# Patient Record
Sex: Female | Born: 1985 | Hispanic: Yes | Marital: Married | Smoking: Never smoker
Health system: Southern US, Community
[De-identification: ages and names within clinical notes are randomized; demographics above are authoritative.]

## PROBLEM LIST (undated history)

## (undated) ENCOUNTER — Inpatient Hospital Stay (HOSPITAL_COMMUNITY): Payer: Self-pay

## (undated) DIAGNOSIS — Z789 Other specified health status: Secondary | ICD-10-CM

## (undated) HISTORY — PX: NO PAST SURGERIES: SHX2092

## (undated) HISTORY — DX: Other specified health status: Z78.9

---
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
## 2004-05-22 ENCOUNTER — Inpatient Hospital Stay (HOSPITAL_COMMUNITY): Admission: AD | Admit: 2004-05-22 | Discharge: 2004-05-22 | Payer: Self-pay | Admitting: *Deleted

## 2004-05-24 ENCOUNTER — Inpatient Hospital Stay (HOSPITAL_COMMUNITY): Admission: AD | Admit: 2004-05-24 | Discharge: 2004-05-24 | Payer: Self-pay | Admitting: Obstetrics & Gynecology

## 2005-04-07 ENCOUNTER — Ambulatory Visit (HOSPITAL_COMMUNITY): Admission: RE | Admit: 2005-04-07 | Discharge: 2005-04-07 | Payer: Self-pay | Admitting: Obstetrics

## 2005-04-15 ENCOUNTER — Inpatient Hospital Stay (HOSPITAL_COMMUNITY): Admission: AD | Admit: 2005-04-15 | Discharge: 2005-04-15 | Payer: Self-pay | Admitting: Obstetrics

## 2005-05-21 ENCOUNTER — Inpatient Hospital Stay (HOSPITAL_COMMUNITY): Admission: AD | Admit: 2005-05-21 | Discharge: 2005-05-21 | Payer: Self-pay | Admitting: Obstetrics & Gynecology

## 2005-09-20 ENCOUNTER — Inpatient Hospital Stay (HOSPITAL_COMMUNITY): Admission: AD | Admit: 2005-09-20 | Discharge: 2005-09-20 | Payer: Self-pay | Admitting: Obstetrics & Gynecology

## 2005-09-20 ENCOUNTER — Inpatient Hospital Stay (HOSPITAL_COMMUNITY): Admission: AD | Admit: 2005-09-20 | Discharge: 2005-09-23 | Payer: Self-pay | Admitting: Obstetrics & Gynecology

## 2007-01-03 IMAGING — CR DG CHEST 1V
1 series · 1 of 1 positions shown · non-contrast
Comparison: none

CLINICAL DATA: Positive PPD.  Pregnant patient.  
 CHEST ? 1 VIEW:

[view not recorded]
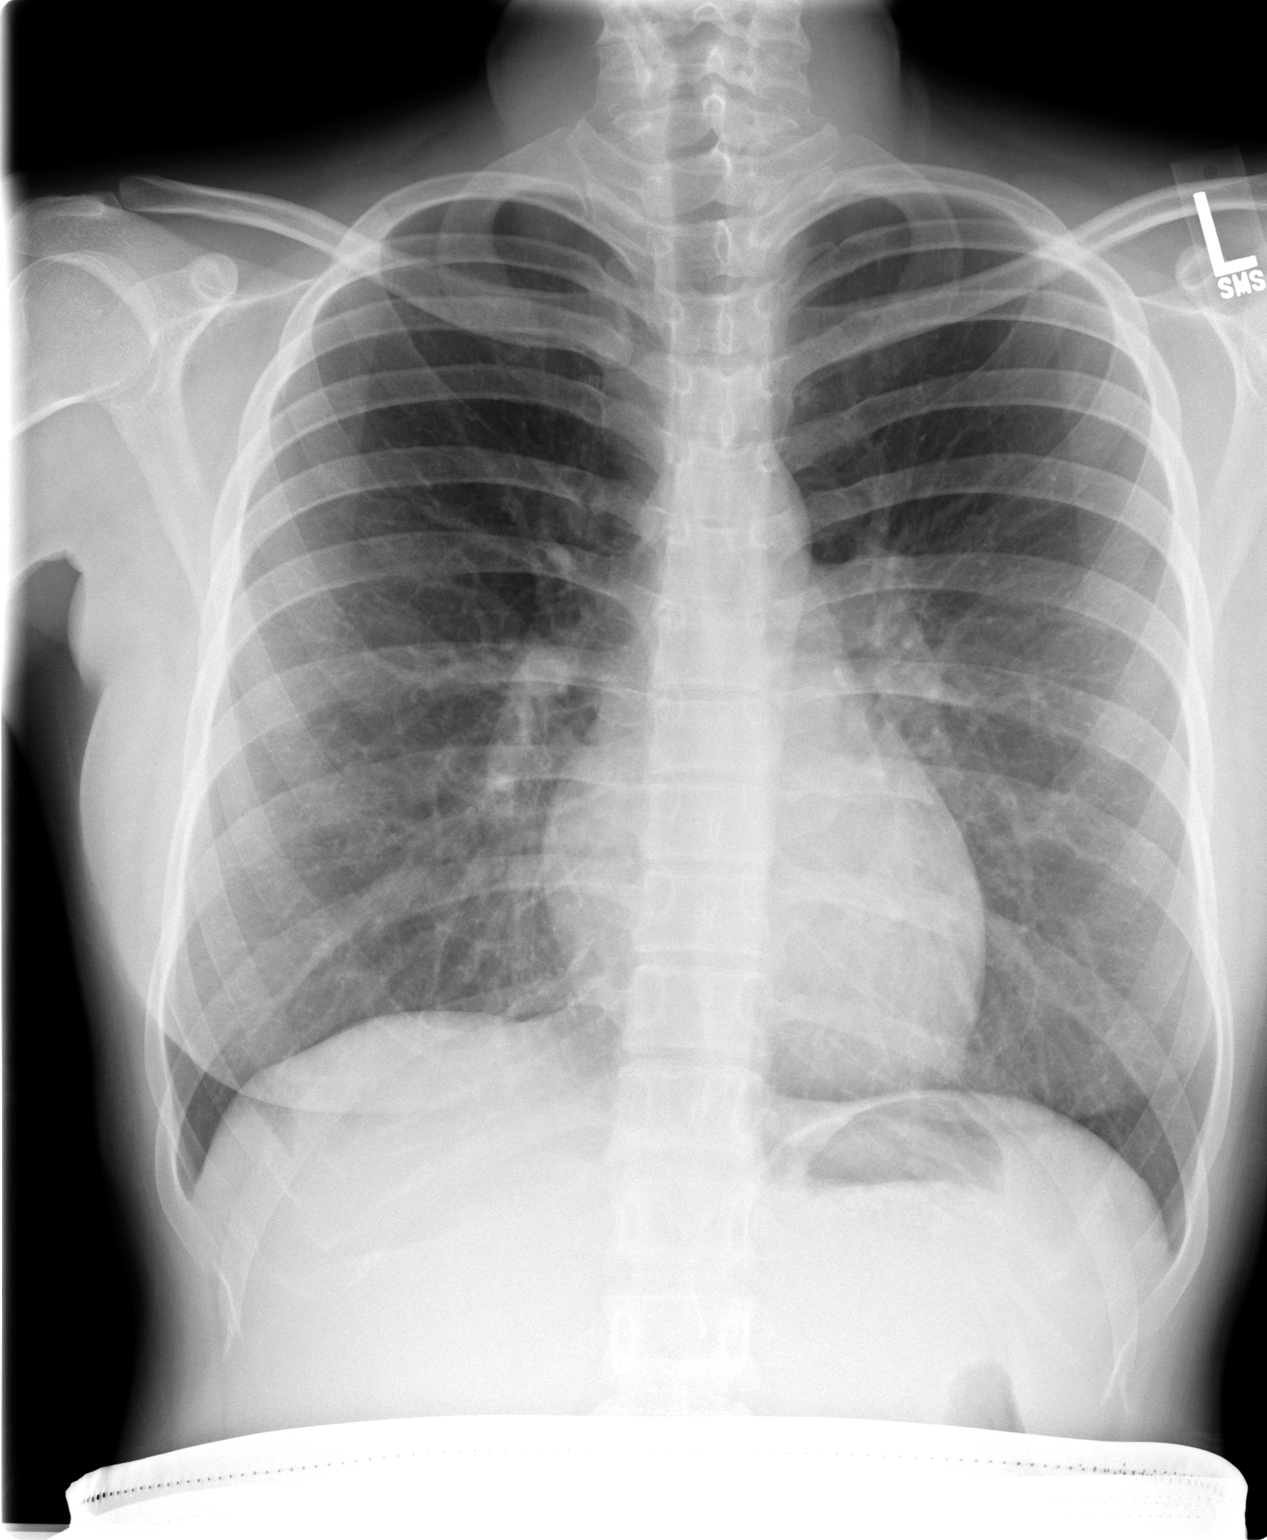

[1 of 1 positions shown; findings below may reference images not displayed]

FINDINGS: PA view of the chest was obtained with abdominal and pelvic shielding.
 Heart size and mediastinal contours are normal.  Both lungs are clear.  There is no evidence of pleural effusion.  No mass or adenopathy identified.
IMPRESSION: No active disease.

## 2009-08-05 ENCOUNTER — Inpatient Hospital Stay (HOSPITAL_COMMUNITY): Admission: AD | Admit: 2009-08-05 | Discharge: 2009-08-05 | Payer: Self-pay | Admitting: Obstetrics and Gynecology

## 2009-09-10 ENCOUNTER — Ambulatory Visit (HOSPITAL_COMMUNITY): Admission: RE | Admit: 2009-09-10 | Discharge: 2009-09-10 | Payer: Self-pay | Admitting: Obstetrics

## 2009-12-01 ENCOUNTER — Inpatient Hospital Stay (HOSPITAL_COMMUNITY): Admission: AD | Admit: 2009-12-01 | Discharge: 2009-12-03 | Payer: Self-pay | Admitting: Obstetrics

## 2010-08-02 LAB — RH IMMUNE GLOB WKUP(>/=20WKS)(NOT WOMEN'S HOSP)
Antibody Screen: NEGATIVE
Fetal Screen: NEGATIVE

## 2010-08-02 LAB — CBC
HCT: 34.2 % — ABNORMAL LOW (ref 36.0–46.0)
HCT: 36.9 % (ref 36.0–46.0)
Hemoglobin: 11.6 g/dL — ABNORMAL LOW (ref 12.0–15.0)
Hemoglobin: 12.4 g/dL (ref 12.0–15.0)
MCH: 28.1 pg (ref 26.0–34.0)
MCH: 28.7 pg (ref 26.0–34.0)
MCHC: 33.6 g/dL (ref 30.0–36.0)
MCHC: 34 g/dL (ref 30.0–36.0)
MCV: 83.7 fL (ref 78.0–100.0)
MCV: 84.3 fL (ref 78.0–100.0)
Platelets: 117 10*3/uL — ABNORMAL LOW (ref 150–400)
Platelets: 149 10*3/uL — ABNORMAL LOW (ref 150–400)
RBC: 4.05 MIL/uL (ref 3.87–5.11)
RBC: 4.41 MIL/uL (ref 3.87–5.11)
RDW: 13.2 % (ref 11.5–15.5)
RDW: 13.3 % (ref 11.5–15.5)
WBC: 11.5 10*3/uL — ABNORMAL HIGH (ref 4.0–10.5)
WBC: 9.1 10*3/uL (ref 4.0–10.5)

## 2010-08-02 LAB — RPR: RPR Ser Ql: NONREACTIVE

## 2010-08-05 LAB — RH IMMUNE GLOBULIN WORKUP (NOT WOMEN'S HOSP)
ABO/RH(D): B NEG
Antibody Screen: POSITIVE
DAT, IgG: NEGATIVE

## 2010-08-10 LAB — URINALYSIS, ROUTINE W REFLEX MICROSCOPIC
Bilirubin Urine: NEGATIVE
Glucose, UA: NEGATIVE mg/dL
Hgb urine dipstick: NEGATIVE
Ketones, ur: 15 mg/dL — AB
Nitrite: NEGATIVE
Protein, ur: NEGATIVE mg/dL
Specific Gravity, Urine: <1.005 — ABNORMAL LOW (ref 1.005–1.030)
Urobilinogen, UA: 0.2 mg/dL (ref 0.0–1.0)
pH: 6 (ref 5.0–8.0)

## 2012-12-20 LAB — OB RESULTS CONSOLE HEPATITIS B SURFACE ANTIGEN: HEP B S AG: NEGATIVE

## 2012-12-20 LAB — OB RESULTS CONSOLE RPR: RPR: NONREACTIVE

## 2012-12-20 LAB — OB RESULTS CONSOLE ANTIBODY SCREEN: Antibody Screen: NEGATIVE

## 2012-12-20 LAB — OB RESULTS CONSOLE RUBELLA ANTIBODY, IGM: Rubella: IMMUNE

## 2012-12-20 LAB — OB RESULTS CONSOLE GC/CHLAMYDIA
Chlamydia: NEGATIVE
Gonorrhea: NEGATIVE

## 2012-12-20 LAB — OB RESULTS CONSOLE ABO/RH: RH TYPE: NEGATIVE

## 2012-12-20 LAB — OB RESULTS CONSOLE HIV ANTIBODY (ROUTINE TESTING): HIV: NONREACTIVE

## 2013-05-18 NOTE — L&D Delivery Note (Signed)
Delivery Note At 3:14 PM a viable female was delivered via Vaginal, Spontaneous Delivery (Presentation: ; Occiput Anterior).  APGAR: , ; weight .   Placenta status: Intact, Spontaneous.  Cord: 3 vessels with the following complications: None.  Cord pH: not done  Anesthesia:   Episiotomy:  Lacerations:  Suture Repair: 2.0 Est. Blood Loss (mL):   Mom to postpartum.  Baby to Couplet care / Skin to Skin.  MARSHALL,BERNARD A 07/15/2013, 3:23 PM

## 2013-06-06 LAB — OB RESULTS CONSOLE GC/CHLAMYDIA
Chlamydia: NEGATIVE
Gonorrhea: NEGATIVE

## 2013-06-06 LAB — OB RESULTS CONSOLE GBS: GBS: NEGATIVE

## 2013-07-06 ENCOUNTER — Inpatient Hospital Stay (HOSPITAL_COMMUNITY): Admission: AD | Admit: 2013-07-06 | Payer: Self-pay | Source: Ambulatory Visit | Admitting: Obstetrics

## 2013-07-14 ENCOUNTER — Encounter (HOSPITAL_COMMUNITY): Payer: Self-pay | Admitting: *Deleted

## 2013-07-14 ENCOUNTER — Telehealth (HOSPITAL_COMMUNITY): Payer: Self-pay | Admitting: *Deleted

## 2013-07-14 NOTE — Telephone Encounter (Signed)
Preadmission screen Interpreter number [phone]

## 2013-07-15 ENCOUNTER — Encounter (HOSPITAL_COMMUNITY): Payer: Self-pay

## 2013-07-15 ENCOUNTER — Encounter (HOSPITAL_COMMUNITY): Payer: Medicaid Other | Admitting: Anesthesiology

## 2013-07-15 ENCOUNTER — Inpatient Hospital Stay (HOSPITAL_COMMUNITY)
Admission: RE | Admit: 2013-07-15 | Discharge: 2013-07-16 | DRG: 775 | Disposition: A | Discharge status: Home / Self Care | Payer: Medicaid Other | Source: Ambulatory Visit | Attending: Obstetrics | Admitting: Obstetrics

## 2013-07-15 ENCOUNTER — Inpatient Hospital Stay (HOSPITAL_COMMUNITY): Payer: Medicaid Other | Admitting: Anesthesiology

## 2013-07-15 DIAGNOSIS — O480 Post-term pregnancy: Principal | ICD-10-CM | POA: Diagnosis present

## 2013-07-15 DIAGNOSIS — Z3490 Encounter for supervision of normal pregnancy, unspecified, unspecified trimester: Secondary | ICD-10-CM

## 2013-07-15 LAB — CBC
HCT: 34.8 % — ABNORMAL LOW (ref 36.0–46.0)
Hemoglobin: 11.6 g/dL — ABNORMAL LOW (ref 12.0–15.0)
MCH: 27.1 pg (ref 26.0–34.0)
MCHC: 33.3 g/dL (ref 30.0–36.0)
MCV: 81.3 fL (ref 78.0–100.0)
Platelets: 161 10*3/uL (ref 150–400)
RBC: 4.28 MIL/uL (ref 3.87–5.11)
RDW: 13.7 % (ref 11.5–15.5)
WBC: 7.8 10*3/uL (ref 4.0–10.5)

## 2013-07-15 LAB — RPR: RPR Ser Ql: NONREACTIVE

## 2013-07-15 LAB — TYPE AND SCREEN
ABO/RH(D): B NEG
Antibody Screen: NEGATIVE

## 2013-07-15 MED ORDER — OXYCODONE-ACETAMINOPHEN 5-325 MG PO TABS: 1.0000 | ORAL_TABLET | ORAL | Status: DC | PRN

## 2013-07-15 MED ORDER — ONDANSETRON HCL 4 MG PO TABS: 4.0000 mg | ORAL_TABLET | ORAL | Status: DC | PRN

## 2013-07-15 MED ORDER — TETANUS-DIPHTH-ACELL PERTUSSIS 5-2.5-18.5 LF-MCG/0.5 IM SUSP
0.5000 mL | Freq: Once | INTRAMUSCULAR | Status: AC
  Administered 2013-07-16: 0.5 mL via INTRAMUSCULAR
  Filled 2013-07-15: qty 0.5

## 2013-07-15 MED ORDER — WITCH HAZEL-GLYCERIN EX PADS: 1.0000 "application " | MEDICATED_PAD | CUTANEOUS | Status: DC | PRN

## 2013-07-15 MED ORDER — LACTATED RINGERS IV SOLN
INTRAVENOUS | Status: DC
Start: 2013-07-15 — End: 2013-07-15
  Administered 2013-07-15: 08:00:00 via INTRAVENOUS

## 2013-07-15 MED ORDER — ZOLPIDEM TARTRATE 5 MG PO TABS: 5.0000 mg | ORAL_TABLET | Freq: Every evening | ORAL | Status: DC | PRN

## 2013-07-15 MED ORDER — FENTANYL 2.5 MCG/ML BUPIVACAINE 1/10 % EPIDURAL INFUSION (WH - ANES)
INTRAMUSCULAR | Status: DC | PRN
Start: 2013-07-15 — End: 2013-07-15
  Administered 2013-07-15: 14 mL/h via EPIDURAL

## 2013-07-15 MED ORDER — PRENATAL MULTIVITAMIN CH
1.0000 | ORAL_TABLET | Freq: Every day | ORAL | Status: DC
  Administered 2013-07-16: 1 via ORAL
  Filled 2013-07-15: qty 1

## 2013-07-15 MED ORDER — ONDANSETRON HCL 4 MG/2ML IJ SOLN: 4.0000 mg | INTRAMUSCULAR | Status: DC | PRN

## 2013-07-15 MED ORDER — DIBUCAINE 1 % RE OINT: 1.0000 "application " | TOPICAL_OINTMENT | RECTAL | Status: DC | PRN

## 2013-07-15 MED ORDER — PHENYLEPHRINE 40 MCG/ML (10ML) SYRINGE FOR IV PUSH (FOR BLOOD PRESSURE SUPPORT)
80.0000 ug | PREFILLED_SYRINGE | INTRAVENOUS | Status: DC | PRN
  Filled 2013-07-15: qty 2

## 2013-07-15 MED ORDER — FERROUS SULFATE 325 (65 FE) MG PO TABS
325.0000 mg | ORAL_TABLET | Freq: Two times a day (BID) | ORAL | Status: DC
  Administered 2013-07-16: 325 mg via ORAL
  Filled 2013-07-15: qty 1

## 2013-07-15 MED ORDER — DIPHENHYDRAMINE HCL 50 MG/ML IJ SOLN
12.5000 mg | INTRAMUSCULAR | Status: DC | PRN
Start: 2013-07-15 — End: 2013-07-16

## 2013-07-15 MED ORDER — OXYTOCIN 40 UNITS IN LACTATED RINGERS INFUSION - SIMPLE MED: 62.5000 mL/h | INTRAVENOUS | Status: DC

## 2013-07-15 MED ORDER — ACETAMINOPHEN 325 MG PO TABS: 650.0000 mg | ORAL_TABLET | ORAL | Status: DC | PRN

## 2013-07-15 MED ORDER — IBUPROFEN 600 MG PO TABS
600.0000 mg | ORAL_TABLET | Freq: Four times a day (QID) | ORAL | Status: DC
  Administered 2013-07-15 – 2013-07-16 (×4): 600 mg via ORAL
  Filled 2013-07-15 (×3): qty 1

## 2013-07-15 MED ORDER — SIMETHICONE 80 MG PO CHEW: 80.0000 mg | CHEWABLE_TABLET | ORAL | Status: DC | PRN

## 2013-07-15 MED ORDER — BENZOCAINE-MENTHOL 20-0.5 % EX AERO
1.0000 "application " | INHALATION_SPRAY | CUTANEOUS | Status: DC | PRN
  Administered 2013-07-15: 1 via TOPICAL
  Filled 2013-07-15: qty 56

## 2013-07-15 MED ORDER — ONDANSETRON HCL 4 MG/2ML IJ SOLN: 4.0000 mg | Freq: Four times a day (QID) | INTRAMUSCULAR | Status: DC | PRN

## 2013-07-15 MED ORDER — OXYTOCIN BOLUS FROM INFUSION: 500.0000 mL | INTRAVENOUS | Status: DC

## 2013-07-15 MED ORDER — LACTATED RINGERS IV SOLN: 500.0000 mL | Freq: Once | INTRAVENOUS | Status: DC

## 2013-07-15 MED ORDER — SENNOSIDES-DOCUSATE SODIUM 8.6-50 MG PO TABS
2.0000 | ORAL_TABLET | ORAL | Status: DC
  Administered 2013-07-15: 2 via ORAL
  Filled 2013-07-15: qty 2

## 2013-07-15 MED ORDER — FENTANYL 2.5 MCG/ML BUPIVACAINE 1/10 % EPIDURAL INFUSION (WH - ANES)
14.0000 mL/h | INTRAMUSCULAR | Status: DC | PRN
  Filled 2013-07-15: qty 125

## 2013-07-15 MED ORDER — LANOLIN HYDROUS EX OINT: TOPICAL_OINTMENT | CUTANEOUS | Status: DC | PRN

## 2013-07-15 MED ORDER — LACTATED RINGERS IV SOLN
500.0000 mL | INTRAVENOUS | Status: DC | PRN
  Administered 2013-07-15: 500 mL via INTRAVENOUS

## 2013-07-15 MED ORDER — EPHEDRINE 5 MG/ML INJ
10.0000 mg | INTRAVENOUS | Status: DC | PRN
  Filled 2013-07-15: qty 2

## 2013-07-15 MED ORDER — EPHEDRINE 5 MG/ML INJ
10.0000 mg | INTRAVENOUS | Status: DC | PRN
  Filled 2013-07-15: qty 2
  Filled 2013-07-15: qty 4

## 2013-07-15 MED ORDER — DIPHENHYDRAMINE HCL 25 MG PO CAPS: 25.0000 mg | ORAL_CAPSULE | Freq: Four times a day (QID) | ORAL | Status: DC | PRN

## 2013-07-15 MED ORDER — CITRIC ACID-SODIUM CITRATE 334-500 MG/5ML PO SOLN
30.0000 mL | ORAL | Status: DC | PRN
Start: 2013-07-15 — End: 2013-07-15

## 2013-07-15 MED ORDER — OXYTOCIN 40 UNITS IN LACTATED RINGERS INFUSION - SIMPLE MED
1.0000 m[IU]/min | INTRAVENOUS | Status: DC
  Administered 2013-07-15: 2 m[IU]/min via INTRAVENOUS
  Filled 2013-07-15: qty 1000

## 2013-07-15 MED ORDER — TERBUTALINE SULFATE 1 MG/ML IJ SOLN: 0.2500 mg | Freq: Once | INTRAMUSCULAR | Status: DC | PRN

## 2013-07-15 MED ORDER — PHENYLEPHRINE 40 MCG/ML (10ML) SYRINGE FOR IV PUSH (FOR BLOOD PRESSURE SUPPORT)
80.0000 ug | PREFILLED_SYRINGE | INTRAVENOUS | Status: DC | PRN
  Filled 2013-07-15: qty 2
  Filled 2013-07-15: qty 10

## 2013-07-15 MED ORDER — LIDOCAINE HCL (PF) 1 % IJ SOLN
30.0000 mL | INTRAMUSCULAR | Status: DC | PRN
  Filled 2013-07-15: qty 30

## 2013-07-15 MED ORDER — IBUPROFEN 600 MG PO TABS
600.0000 mg | ORAL_TABLET | Freq: Four times a day (QID) | ORAL | Status: DC | PRN
  Filled 2013-07-15: qty 1

## 2013-07-15 MED ORDER — FLEET ENEMA 7-19 GM/118ML RE ENEM: 1.0000 | ENEMA | RECTAL | Status: DC | PRN

## 2013-07-15 MED ORDER — LIDOCAINE HCL (PF) 1 % IJ SOLN
INTRAMUSCULAR | Status: DC | PRN
  Administered 2013-07-15 (×2): 4 mL

## 2013-07-15 MED ORDER — BUTORPHANOL TARTRATE 1 MG/ML IJ SOLN: 1.0000 mg | INTRAMUSCULAR | Status: DC | PRN

## 2013-07-15 NOTE — Anesthesia Procedure Notes (Signed)
Epidural Patient location during procedure: OB Start time: 07/15/2013 1:12 PM  Staffing Anesthesiologist: Aneesha Holloran A. Performed by: anesthesiologist   Preanesthetic Checklist Completed: patient identified, site marked, surgical consent, pre-op evaluation, timeout performed, IV checked, risks and benefits discussed and monitors and equipment checked  Epidural Patient position: sitting Prep: site prepped and draped and DuraPrep Patient monitoring: continuous pulse ox and blood pressure Approach: midline Injection technique: LOR air  Needle:  Needle type: Tuohy  Needle gauge: 17 G Needle length: 9 cm and 9 Needle insertion depth: 4 cm Catheter type: closed end flexible Catheter size: 19 Gauge Catheter at skin depth: 9 cm Test dose: negative and Other  Assessment Events: blood not aspirated, injection not painful, no injection resistance, negative IV test and no paresthesia  Additional Notes Patient identified. Risks and benefits discussed including failed block, incomplete  Pain control, post dural puncture headache, nerve damage, paralysis, blood pressure Changes, nausea, vomiting, reactions to medications-both toxic and allergic and post Partum back pain. All questions were answered. Patient expressed understanding and wished to proceed. Sterile technique was used throughout procedure. Epidural site was Dressed with sterile barrier dressing. No paresthesias, signs of intravascular injection Or signs of intrathecal spread were encountered.  Patient was more comfortable after the epidural was dosed. Please see RN's note for documentation of vital signs and FHR which are stable.

## 2013-07-15 NOTE — H&P (Signed)
And weekly this is Dr. Francoise CeoBernard Xitlalic Maslin dictating the history and physical on  Victoria Liu she's a 28 year old gravida 3 para 2 all to at 41 weeks she's due 07/08/1949 negative GBS admitted for induction cervix 3-4 cm 80-90% vertex -2 cervix posterior amniotomy performed the fluids clear she is on low-dose Pitocin Past medical history negative past surgical history negative Social history negative System review negative Physical well-developed female in no distress HEENT negative Lungs clear to P&A Heart regular rhythm no murmurs no gallops Breasts negative Abdomen term Pelvic as described above Extremities negative and

## 2013-07-15 NOTE — Anesthesia Preprocedure Evaluation (Signed)

## 2013-07-16 LAB — CBC
HCT: 33.5 % — ABNORMAL LOW (ref 36.0–46.0)
Hemoglobin: 11.2 g/dL — ABNORMAL LOW (ref 12.0–15.0)
MCH: 27.3 pg (ref 26.0–34.0)
MCHC: 33.4 g/dL (ref 30.0–36.0)
MCV: 81.5 fL (ref 78.0–100.0)
PLATELETS: 155 10*3/uL (ref 150–400)
RBC: 4.11 MIL/uL (ref 3.87–5.11)
RDW: 13.7 % (ref 11.5–15.5)
WBC: 10.3 10*3/uL (ref 4.0–10.5)

## 2013-07-16 MED ORDER — RHO D IMMUNE GLOBULIN 1500 UNIT/2ML IJ SOLN
300.0000 ug | Freq: Once | INTRAMUSCULAR | Status: AC
  Administered 2013-07-16: 300 ug via INTRAMUSCULAR
  Filled 2013-07-16: qty 2

## 2013-07-16 MED ORDER — INFLUENZA VAC SPLIT QUAD 0.5 ML IM SUSP
0.5000 mL | INTRAMUSCULAR | Status: AC
Start: 2013-07-16 — End: 2013-07-16
  Administered 2013-07-16: 0.5 mL via INTRAMUSCULAR
  Filled 2013-07-16: qty 0.5

## 2013-07-16 NOTE — Discharge Instructions (Signed)
Discharge instructions ° °· You can wash your hair °· Shower °· Eat what you want °· Drink what you want °· See me in 6 weeks °· Your ankles are going to swell more in the next 2 weeks than when pregnant °· No sex for 6 weeks ° ° °MARSHALL,BERNARD A, MD 07/16/2013 ° ° °

## 2013-07-16 NOTE — Discharge Summary (Signed)
Obstetric Discharge Summary Reason for Admission: induction of labor Prenatal Procedures: none Intrapartum Procedures: spontaneous vaginal delivery Postpartum Procedures: none Complications-Operative and Postpartum: none Hemoglobin  Date Value Ref Range Status  07/16/2013 11.2* 12.0 - 15.0 g/dL Final     HCT  Date Value Ref Range Status  07/16/2013 33.5* 36.0 - 46.0 % Final    Physical Exam:  General: alert Lochia: appropriate Uterine Fundus: firm Incision: healing well DVT Evaluation: No evidence of DVT seen on physical exam.  Discharge Diagnoses: Term Pregnancy-delivered  Discharge Information: Date: 07/16/2013 Activity: pelvic rest Diet: routine Medications: Percocet Condition: stable Instructions: refer to practice specific booklet Discharge to: home Follow-up Information   Follow up with Victoria CosierMARSHALL,[name], MD.   Specialty:  Obstetrics and Gynecology   Contact information:   [address] [place] [id number] [phone]       Newborn Data: Live born female  Birth Weight: 8 lb 4.8 oz (3765 g) APGAR: 9, 9  Home with mother.  Victoria Liu Liu 07/16/2013, 6:57 AM

## 2013-07-16 NOTE — Anesthesia Postprocedure Evaluation (Signed)
Anesthesia Post Note  Patient: Victoria Liu  Procedure(s) Performed: * No procedures listed *  Anesthesia type: Epidural  Patient location: Mother/Baby  Post pain: Pain level controlled  Post assessment: Post-op Vital signs reviewed  Last Vitals:  Filed Vitals:   07/16/13 0600  BP: 103/68  Pulse: 73  Temp: 37 C  Resp: 20    Post vital signs: Reviewed  Level of consciousness:alert  Complications: No apparent anesthesia complications

## 2013-07-17 LAB — RH IG WORKUP (INCLUDES ABO/RH)
ABO/RH(D): B NEG
FETAL SCREEN: NEGATIVE
GESTATIONAL AGE(WKS): 41
Unit division: 0

## 2013-07-17 NOTE — Progress Notes (Signed)
Post discharge chart review completed.  

## 2014-03-19 ENCOUNTER — Encounter (HOSPITAL_COMMUNITY): Payer: Self-pay

## 2016-02-05 ENCOUNTER — Other Ambulatory Visit (INDEPENDENT_AMBULATORY_CARE_PROVIDER_SITE_OTHER): Payer: Self-pay

## 2016-02-05 DIAGNOSIS — Z3481 Encounter for supervision of other normal pregnancy, first trimester: Secondary | ICD-10-CM

## 2016-02-05 LAB — POCT URINALYSIS DIPSTICK
Bilirubin, UA: NEGATIVE
Blood, UA: NEGATIVE
GLUCOSE UA: NEGATIVE
KETONES UA: NEGATIVE
Nitrite, UA: NEGATIVE
PROTEIN UA: NEGATIVE
Spec Grav, UA: 1.02
Urobilinogen, UA: 0.2
pH, UA: 7.5

## 2016-02-05 LAB — POCT UA - MICROSCOPIC ONLY

## 2016-02-05 LAB — OB RESULTS CONSOLE HEPATITIS B SURFACE ANTIGEN: Hepatitis B Surface Ag: NEGATIVE

## 2016-02-05 LAB — HIV ANTIBODY (ROUTINE TESTING W REFLEX): HIV: NONREACTIVE

## 2016-02-05 LAB — OB RESULTS CONSOLE RUBELLA ANTIBODY, IGM: RUBELLA: IMMUNE

## 2016-02-06 LAB — OBSTETRIC PANEL
ANTIBODY SCREEN: NEGATIVE
Basophils Absolute: 0 cells/uL (ref 0–200)
Basophils Relative: 0 %
EOS ABS: 81 {cells}/uL (ref 15–500)
Eosinophils Relative: 1 %
HEMATOCRIT: 35.3 % (ref 35.0–45.0)
HEMOGLOBIN: 11.6 g/dL — AB (ref 11.7–15.5)
Hepatitis B Surface Ag: NEGATIVE
LYMPHS PCT: 22 %
Lymphs Abs: 1782 cells/uL (ref 850–3900)
MCH: 27 pg (ref 27.0–33.0)
MCHC: 32.9 g/dL (ref 32.0–36.0)
MCV: 82.1 fL (ref 80.0–100.0)
MPV: 11.5 fL (ref 7.5–12.5)
Monocytes Absolute: 405 cells/uL (ref 200–950)
Monocytes Relative: 5 %
Neutro Abs: 5832 cells/uL (ref 1500–7800)
Neutrophils Relative %: 72 %
Platelets: 194 10*3/uL (ref 140–400)
RBC: 4.3 MIL/uL (ref 3.80–5.10)
RDW: 13.8 % (ref 11.0–15.0)
RUBELLA: 6.3 {index} — AB (ref <0.90)
Rh Type: NEGATIVE
WBC: 8.1 10*3/uL (ref 3.8–10.8)

## 2016-02-06 LAB — SICKLE CELL SCREEN: Sickle Cell Screen: NEGATIVE

## 2016-02-07 LAB — CULTURE, OB URINE

## 2016-02-12 ENCOUNTER — Ambulatory Visit (INDEPENDENT_AMBULATORY_CARE_PROVIDER_SITE_OTHER): Payer: Self-pay | Admitting: Family Medicine

## 2016-02-12 ENCOUNTER — Encounter: Payer: Self-pay | Admitting: Family Medicine

## 2016-02-12 VITALS — BP 111/64 | HR 87 | Temp 98.1°F | Wt 164.0 lb

## 2016-02-12 DIAGNOSIS — Z3492 Encounter for supervision of normal pregnancy, unspecified, second trimester: Secondary | ICD-10-CM

## 2016-02-12 DIAGNOSIS — R300 Dysuria: Secondary | ICD-10-CM

## 2016-02-12 DIAGNOSIS — Z6791 Unspecified blood type, Rh negative: Secondary | ICD-10-CM | POA: Insufficient documentation

## 2016-02-12 DIAGNOSIS — Z124 Encounter for screening for malignant neoplasm of cervix: Secondary | ICD-10-CM

## 2016-02-12 DIAGNOSIS — L299 Pruritus, unspecified: Secondary | ICD-10-CM

## 2016-02-12 LAB — POCT URINALYSIS DIPSTICK
Bilirubin, UA: NEGATIVE
Blood, UA: NEGATIVE
Glucose, UA: NEGATIVE
Ketones, UA: NEGATIVE
Nitrite, UA: NEGATIVE
PROTEIN UA: NEGATIVE
UROBILINOGEN UA: 0.2
pH, UA: 6

## 2016-02-12 MED ORDER — CETIRIZINE HCL 10 MG PO TABS: 10.0000 mg | ORAL_TABLET | Freq: Every day | ORAL | 5 refills | Status: DC

## 2016-02-12 NOTE — Patient Instructions (Signed)
Cuidados prenatales  (Prenatal Care)  ¿QUÉ SON LOS CUIDADOS PRENATALES?   Los cuidados prenatales son aquellos que se brindan a una embarazada antes del parto. Los cuidados prenatales garantizan que la embarazada y el feto estén tan sanos como sea posible durante todo el embarazo. Pueden brindar este tipo de cuidados una matrona, un médico de atención primaria o un especialista en parto y embarazo (obstetra). Los cuidados prenatales incluyen exámenes físicos, estudios, tratamientos e información sobre nutrición, estilo de vida y servicios de apoyo social.  ¿POR QUÉ SON TAN IMPORTANTES LOS CUIDADOS PRENATALES?   Los cuidados prenatales recibidos desde un inicio y de forma periódica aumentan la probabilidad de que usted y el bebé permanezcan sanos durante todo el embarazo. Este tipo de cuidados también reduce el riesgo de que el bebé nazca mucho antes de la fecha probable de parto (prematuro) o de que sea más pequeño de lo previsto (pequeño para la edad gestacional).Durante las visitas prenatales, se analiza cualquier clase de enfermedad preexistente que usted pueda tener y que represente un riesgo durante el embarazo. También la monitorearán con regularidad para detectar cualquier afección que pueda surgir durante el embarazo, a fin de tratarla con rapidez y eficacia.  ¿QUÉ SUCEDE DURANTE LAS VISITAS PRENATALES?  Las visitas prenatales pueden incluir lo siguiente:  Diálogo  Informe al médico cualquier signo o síntoma nuevo que haya tenido desde la última visita. Estos pueden incluir los siguientes:  · Náuseas o vómitos.  · Aumento o disminución del nivel de energía.  · Dificultad para dormir.  · Dolor en la espalda o las piernas.  · Cambios en el peso.  · Ganas frecuentes de orinar.  · Falta de aire al realizar actividad física.  · Cambios en la piel, por ejemplo, una erupción cutánea o picazón.  · Sangrado o flujo vaginal.  · Sensación de excitación o nerviosismo.  · Cambios en los movimientos del feto.  Es  conveniente que escriba cualquier pregunta o tema del que quiera hablar con el médico, para llevarlo anotado a la cita.  Exámenes  Durante la primera visita prenatal, es probable que le hagan un examen físico completo. El médico le revisará con frecuencia la vagina, el cuello del útero y la posición del útero, además de examinarle el corazón, los pulmones y otras partes del cuerpo. A medida que el embarazo avance, el médico medirá el tamaño del útero y verificará la posición del feto dentro del útero. También puede examinarla para detectar los primeros signos del trabajo de parto. Las visitas prenatales también pueden incluir el control de la presión arterial y, después de 10 a 12 semanas de embarazo, aproximadamente, el control de los latidos del feto.  Estudios  Los estudios habituales suelen incluir lo siguiente:  · Análisis de orina. Este análisis examina la presencia de glucosa, proteínas o signos de infección en la orina.  · Recuento sanguíneo. Este análisis verifica el nivel de glóbulos rojos y blancos en el organismo.  · Pruebas de enfermedades de transmisión sexual (ETS). Las pruebas de detección de ETS al comienzo del embarazo son una práctica de rutina, y en muchos estados es obligación practicarlas.  · Análisis de anticuerpos. La examinarán para ver si es inmune a determinadas enfermedades, como la rubéola, que puede afectar al feto en desarrollo.  · Detección de glucosa. Entre la semana 24 y 28 de embarazo, le analizarán el nivel de glucemia para detectar signos de diabetes gestacional. Pueden recomendarle un análisis de seguimiento.  · Estreptococos del grupo B. Es común encontrar estas bacterias   dentro de la vagina. Este análisis le indicará al médico si necesita darle un antibiótico para reducir la cantidad de este tipo de bacterias en el cuerpo antes del trabajo de parto y el parto.  · Ecografías. Alrededor de la semana 18 a 20 de embarazo, muchas embarazadas se hacen ecografías para evaluar la  salud del feto y detectar cualquier anomalía en el desarrollo.  · Prueba del VIH (virus de inmunodeficiencia humana). Al comienzo del embarazo, le harán una prueba de detección del VIH. Si corre un riesgo alto de tener VIH, pueden repetirle esta prueba durante el tercer trimestre del embarazo.  Pueden indicarle otro tipo de estudios según su edad, sus antecedentes médicos personales o familiares, u otros factores.   ¿CON QUÉ FRECUENCIA DEBO VISITAR AL MÉDICO PARA LOS CUIDADOS PRENATALES?  El programa de control correspondiente a los cuidados prenatales dependerá de cualquier enfermedad que usted tenga desde antes del embarazo o que haya desarrollado durante el mismo. Si usted no tiene ninguna enfermedad preexistente, es probable que le hagan los siguientes controles:  · Una vez al mes durante los primeros 6 meses de embarazo.  · Dos veces al mes durante el séptimo y el octavo mes de embarazo.  · Una vez a la semana en el noveno mes de embarazo y hasta el parto.  Si presenta signos de trabajo de parto prematuro u otros signos o síntomas preocupantes, es posible que deba ver al médico con más frecuencia. Consulte al médico sobre el programa de cuidados prenatales más adecuado para su caso.  ¿QUÉ PUEDO HACER PARA QUE EL BEBÉ Y YO ESTEMOS TAN SANOS COMO SEA POSIBLE DURANTE EL EMBARAZO?  · Tome una vitamina prenatal que contenga 400 microgramos (0,4 mg) de ácido fólico todos los días. El médico también puede indicarle que tome vitaminas adicionales, como yodo, vitamina D, hierro, cobre y zinc.  · Tome de 1500 a 2000 mg de calcio todos los días desde la semana 20 de embarazo hasta el parto.  · Asegúrese de estar al día con las vacunas. A menos que el médico le indique otra cosa:    Debe aplicarse la vacuna contra la difteria, el tétanos y la tosferina (Tdap) entre la semana 27 y 36 de embarazo, independientemente de la fecha en la que recibió la última vacuna Tdap. Esta vacuna ayuda a proteger al bebé contra la tosferina  después del nacimiento.    Debe recibir una vacuna antigripal inactivada (IIV) anual como ayuda para protegerlos a usted y al bebé de la gripe. Puede recibirla en cualquier momento del embarazo.  · Siga una dieta bien equilibrada, que incluya lo siguiente:    Frutas y verduras frescas.    Proteínas magras.    Alimentos con alto contenido de calcio, como leche, yogur, quesos duros y verduras de hojas color verde oscuro.    Panes integrales.  · No coma frutos de mar con alto contenido de mercurio, por ejemplo:    Pez espada.    Azulejo.    Tiburón.    Caballa.    Más de 6 onzas de atún por semana.  · No coma lo siguiente:    Carnes o huevos crudos o mal cocidos.    Alimentos no pasteurizados, como quesos blandos (brie, azul o feta), jugos y leche.    Embutidos.    Salchichas que no se cocinaron en agua hirviendo.  · Beba suficiente agua para mantener la orina clara o de color amarillo pálido. Para muchas mujeres, la cantidad es de 10 o más vasos de 8 onzas de agua cada día. El hecho   de mantenerse hidratada ayuda a que el feto reciba nutrientes y puede evitar el inicio de contracciones uterinas prematuras.  · No consuma ningún producto que contenga tabaco, como cigarrillos, tabaco de mascar o cigarrillos electrónicos. Si necesita ayuda para dejar de fumar, consulte al médico.  · No consuma bebidas que contengan alcohol. No se ha determinado que haya un nivel de consumo de alcohol que sea inocuo durante el embarazo.  · No consuma drogas. Estas pueden dañar al feto en desarrollo o causar un aborto espontáneo.  · Consulte al médico o al farmacéutico antes de tomar cualquier medicamento recetado o de venta libre, hierbas o suplementos.  · Limite el consumo de cafeína a no más de 200 mg por día.  · Haga actividad física. A menos que el médico le indique otra cosa, intente hacer 30 minutos de ejercicio moderado la mayoría de los días de la semana. No practique actividades de alto impacto, deportes de contacto o actividades  con alto riesgo de caídas, como equitación o esquí extremo.  · Descanse lo suficiente.  · Evite todo aquello que aumente la temperatura corporal, como jacuzzis y saunas.  · Si tiene un gato, no vacíe la bandeja sanitaria. Las bacterias presentes en las heces del gato pueden causar una infección llamada toxoplasmosis. Esta puede dañar gravemente al feto.  · Aléjese de las sustancias químicas como insecticidas, plomo y mercurio, y de los productos de limpieza o pinturas que contengan solventes.  · No se saque ninguna radiografía, excepto si es necesaria por razones médicas.  · Tome una clase de preparación para el parto y el amamantamiento. Pregúntele al médico si necesita una derivación o una recomendación.     Esta información no tiene como fin reemplazar el consejo del médico. Asegúrese de hacerle al médico cualquier pregunta que tenga.     Document Released: 10/21/2007 Document Revised: 05/25/2014  Elsevier Interactive Patient Education ©2016 [employer]

## 2016-02-12 NOTE — Progress Notes (Signed)
Shanielle Ortega-Perez is a 30 y.o. yo G4P3003 at 2016wRosendo Gros1d based on LMP who presents for her initial prenatal visit. Pregnancy  is planned She reports dysuria. She also reports an itchy rash for the last month on her legs, torso, and arms. She is taking PNV. See flow sheet for details.  PMH, POBH, FH, meds, allergies and Social Hx reviewed.  Prenatal exam:Gen: Well nourished, well developed.  No distress.  Vitals noted. HEENT: Normocephalic, atraumatic.  Neck supple without cervical lymphadenopathy, thyromegaly or thyroid nodules.  fair dentition. CV: RRR, positive flow murmur, no gallops or rubs Lungs: CTAB.  Normal respiratory effort without wheezes or rales. Abd: soft, NTND. +BS.  Uterus not appreciated above pelvis. GU: Normal external female genitalia without lesions.  Nl vaginal, well rugated without lesions. No vaginal discharge.  Bimanual exam: No adnexal mass or TTP. No CMT.   Ext: No clubbing, cyanosis or edema. Psych: Normal grooming and dress.  Not depressed or anxious appearing.  Normal thought content and process without flight of ideas or looseness of associations Skin: several <481mm erythematous lesions on legs, abdomen, and upper extremities. Possible excoriations.       Assessment/plan: 1) Pregnancy at 4479w1d based on LMP, doing well.  Current pregnancy issues include pruritic rash and dysuria.   Pruritic rash: unclear etiology. Could be contact dermatitis vs bug bites vs PUPPS (although unlikely based on gestational age). Rash could possibly be excoriations from pruitis, therefore will order Bile Acids to rule out cholestasis of pregnancy. Patient will need to be fasting for at least 8 hours for bile acids lab, she will come back this week after fasting for this lab. Will try Zyrtec daily in the meantime.   Dysuria: Last UA was dirty, urine cx grew E. Coli. Will obtain another UA and culture at this time. Dating is reliable Prenatal labs reviewed, notable for dirty UA and  urine cx growing E. Coli. Additionally is Rh negative. Bleeding and pain precautions reviewed. Importance of prenatal vitamins reviewed.  Genetic screening; too late for initial screening. Early glucola is not indicated.    Follow up 4 weeks.   Anders Simmondshristina Gambino, MD [facility], PGY-2

## 2016-02-13 LAB — CYTOLOGY - PAP

## 2016-02-14 LAB — URINE CULTURE

## 2016-02-19 ENCOUNTER — Telehealth: Payer: Self-pay | Admitting: Family Medicine

## 2016-02-19 MED ORDER — CEPHALEXIN 500 MG PO CAPS: 500.0000 mg | ORAL_CAPSULE | Freq: Two times a day (BID) | ORAL | 0 refills | Status: AC

## 2016-02-19 NOTE — Telephone Encounter (Signed)
Called patient with [place] interpreter to discuss urine culture results, positive for 50-100,000 colonies of E. Coli. She stated that she is not having any dysuria or other UTI symptoms. Will go ahead and send prescription for Keflex 500 mg BID x 7 days. Discussed with patient that we will check her urine at her next prenatal visit for a test of cure. Patient had no questions at this time and was appreciative of the call.  Anders Simmondshristina Joycie Aerts, MD [facility], PGY-2

## 2016-02-27 LAB — GLUCOSE, POCT (MANUAL RESULT ENTRY): POC Glucose: 137 mg/dl — AB (ref 70–99)

## 2016-03-06 ENCOUNTER — Other Ambulatory Visit: Payer: Self-pay

## 2016-03-06 DIAGNOSIS — L299 Pruritus, unspecified: Secondary | ICD-10-CM

## 2016-03-10 LAB — BILE ACIDS, TOTAL: BILE ACIDS TOTAL: 6 umol/L (ref 0–19)

## 2016-03-10 NOTE — Addendum Note (Signed)
Addended by: Beaulah DinningGAMBINO, Ulrich Soules M on: 03/10/2016 08:29 AM   Modules accepted: Orders

## 2016-03-11 ENCOUNTER — Ambulatory Visit (INDEPENDENT_AMBULATORY_CARE_PROVIDER_SITE_OTHER): Payer: Self-pay | Admitting: Family Medicine

## 2016-03-11 VITALS — BP 89/49 | HR 97 | Temp 98.4°F | Wt 167.0 lb

## 2016-03-11 DIAGNOSIS — R8271 Bacteriuria: Secondary | ICD-10-CM

## 2016-03-11 NOTE — Patient Instructions (Addendum)
Thank you for coming in today, it was so nice to see you! Today we talked about:   You are doing great!   We will recheck your BP and urine on 10/30.   Please follow up in 4 weeks. You can schedule this appointment at the front desk before you leave or call the clinic.  If you have any questions or concerns, please do not hesitate to call the office at [phone]. You can also message me directly via MyChart.   Sincerely,  Anders Simmondshristina Levante Simones, MD  Gwynneth AlimentSegundo trimestre de Black Hammockembarazo (Second Trimester of Pregnancy) El segundo trimestre va desde la semana13 hasta la 28, desde el cuarto hasta el sexto mes, y suele ser el momento en el que mejor se siente. Su organismo se ha adaptado a Charity fundraiserestar embarazada y comienza a Diplomatic Services operational officersentirse fsicamente mejor. En general, las nuseas matutinas han disminuido o han desaparecido completamente, puede tener ms energa y un aumento de apetito. El segundo trimestre es tambin la poca en la que el feto se desarrolla rpidamente. Hacia el final del sexto mes, el feto mide aproximadamente 9pulgadas (23cm) y pesa alrededor de 1 libras (700g). Es probable que sienta que el beb se Teacher, English as a foreign languagemueve (da pataditas) entre las 18 y 20semanas del Psychiatristembarazo. CAMBIOS EN EL ORGANISMO Su organismo atraviesa por muchos cambios durante el Missionembarazo, y estos varan de Neomia Dearuna mujer a Educational psychologistotra.   Seguir American Standard Companiesaumentando de peso. Notar que la parte baja del abdomen sobresale.  Podrn aparecer las primeras Albertson'sestras en las caderas, el abdomen y las Bathmamas.  Es posible que tenga dolores de cabeza que pueden aliviarse con los medicamentos que el mdico le permita tomar.  Tal vez tenga necesidad de orinar con ms frecuencia porque el feto est ejerciendo presin Ambulance personsobre la vejiga.  Debido al Vanetta Muldersembarazo podr sentir Anthoney Haradaacidez estomacal con frecuencia.  Puede estar estreida, ya que ciertas hormonas enlentecen los movimientos de los msculos que New York Life Insuranceempujan los desechos a travs de los intestinos.  Pueden aparecer  hemorroides o abultarse e hincharse las venas (venas varicosas).  Puede tener dolor de espalda que se debe al Citigroupaumento de peso y a que las hormonas del Management consultantembarazo relajan las articulaciones entre los huesos de la pelvis, y Public librariancomo consecuencia de la modificacin del peso y los msculos que mantienen el equilibrio.  Las ConAgra Foodsmamas seguirn creciendo y Development worker, communityle dolern.  Las Veterinary surgeonencas pueden sangrar y estar sensibles al cepillado y al hilo dental.  Pueden aparecer zonas oscuras o manchas (cloasma, mscara del Psychiatristembarazo) en el rostro que probablemente se atenuar despus del nacimiento del beb.  Es posible que se forme una lnea oscura desde el ombligo hasta la zona del pubis (linea nigra) que probablemente se atenuar despus del nacimiento del beb.  Tal vez haya cambios en el cabello que pueden incluir su engrosamiento, crecimiento rpido y cambios en la textura. Adems, a algunas mujeres se les cae el cabello durante o despus del embarazo, o tienen el cabello seco o fino. Lo ms probable es que el cabello se le normalice despus del nacimiento del beb. QU DEBE ESPERAR EN LAS CONSULTAS PRENATALES Durante una visita prenatal de rutina:  La pesarn para asegurarse de que usted y el feto estn creciendo normalmente.  Le tomarn la presin arterial.  Le medirn el abdomen para controlar el desarrollo del beb.  Se escucharn los latidos cardacos fetales.  Se evaluarn los resultados de los estudios solicitados en visitas anteriores. El mdico puede preguntarle lo siguiente:  Cmo se siente.  Si siente los movimientos  del beb.  Si ha tenido sntomas anormales, como prdida de lquido, [place], dolores de cabeza intensos o clicos abdominales.  Si est consumiendo algn producto que contenga tabaco, como cigarrillos, tabaco de Theatre manager y Administrator, Civil Service.  Si tiene [employer]. Otros estudios que podrn realizarse durante el segundo trimestre incluyen lo siguiente:  Anlisis de sangre  para detectar lo siguiente:  Concentraciones de hierro bajas (anemia).  Diabetes gestacional (entre la semana 24 y la 28).  Anticuerpos Rh.  Anlisis de orina para detectar infecciones, diabetes o protenas en la orina.  Una ecografa para confirmar que el beb crece y se desarrolla correctamente.  Una amniocentesis para diagnosticar posibles problemas genticos.  Estudios del feto para descartar espina bfida y sndrome de Down.  Prueba del VIH (virus de inmunodeficiencia humana). Los exmenes prenatales de rutina incluyen la prueba de deteccin del VIH, a menos que decida no Futures trader. INSTRUCCIONES PARA EL CUIDADO EN EL HOGAR   Evite fumar, consumir hierbas, beber alcohol y tomar frmacos que no le hayan recetado. Estas sustancias qumicas afectan la formacin y el desarrollo del beb.  No consuma ningn producto que contenga tabaco, lo que incluye cigarrillos, tabaco de Theatre manager y Administrator, Civil Service. Si necesita ayuda para dejar de fumar, consulte al American Express. Puede recibir asesoramiento y otro tipo de recursos para dejar de fumar.  Siga las indicaciones del mdico en relacin con el uso de medicamentos. Durante el embarazo, hay medicamentos que son seguros de tomar y otros que no.  Haga ejercicio solamente como se lo haya indicado el mdico. Sentir clicos uterinos es un buen signo para Restaurant manager, fast food actividad fsica.  Contine comiendo alimentos sanos con regularidad.  Use un sostn que le brinde buen soporte si le [employer].  No se d baos de inmersin en agua caliente, baos turcos ni saunas.  Use el cinturn de seguridad en todo momento mientras conduce.  No coma carne cruda ni queso sin cocinar; evite el contacto con las bandejas sanitarias de los gatos y la tierra que estos animales usan. Estos elementos contienen grmenes que pueden causar defectos congnitos en el beb.  Tome las vitaminas prenatales.  Tome entre 1500 y 2000mg  de calcio diariamente  comenzando en la semana20 del embarazo [place].  Si est estreida, pruebe un laxante suave (si el mdico lo autoriza). Consuma ms alimentos ricos en fibra, como vegetales y frutas frescos y Radiation protection practitioner. Beba gran cantidad de lquido para mantener la orina de tono claro o color amarillo plido.  Dese baos de asiento con agua tibia para Engineer, materials o las molestias causadas por las hemorroides. Use una crema para las hemorroides si el mdico la autoriza.  Si tiene venas varicosas, use medias de descanso. Eleve los pies durante , 3 o 4veces por da. Limite el consumo de sal en su dieta.  No levante objetos pesados, use zapatos de tacones bajos y [address].  Descanse con las piernas elevadas si tiene calambres o dolor de cintura.  Visite a su dentista si an no lo ha Occupational hygienist. Use un cepillo de dientes blando para higienizarse los dientes y psese el hilo dental con suavidad.  Puede seguir [employer] Corporation, a menos que el mdico le indique lo contrario.  Concurra a todas las visitas prenatales segn las indicaciones de su mdico. SOLICITE ATENCIN MDICA SI:   [place].  Siente clicos leves, presin en la pelvis o dolor persistente en el abdomen.  Tiene nuseas, vmitos o  diarrea persistentes.  Brett Fairy secrecin vaginal con mal olor.  Siente dolor al ConocoPhillips. SOLICITE ATENCIN MDICA DE INMEDIATO SI:   Tiene fiebre.  Tiene una prdida de lquido por la vagina.  Tiene sangrado o pequeas prdidas vaginales.  Siente dolor intenso o clicos en el abdomen.  Sube o baja de peso rpidamente.  Tiene dificultad para respirar y siente dolor de pecho.  Sbitamente se le hinchan mucho el rostro, las [place], los tobillos, los pies o las piernas.  No ha sentido los movimientos del beb durante Georgianne Fick.  Siente un dolor de cabeza intenso que no se alivia con medicamentos.  Su visin se modifica.    Esta informacin no tiene Theme park manager el consejo del mdico. Asegrese de hacerle al mdico cualquier pregunta que tenga.   Document Released: 02/11/2005 Document Revised: 05/25/2014 Elsevier Interactive Patient Education [employer]

## 2016-03-11 NOTE — Progress Notes (Signed)
Victoria Liu is a 30 y.o. G4P3003 at 4665w1d for routine follow up. She reports some fetal movement. Denies any contractions, cramps, vaginal discharge or spotting. Denies any dysuria. The rash that she previously had resolved. Patient leaves that the rash was due to a detergent that she was using because when she switched detergents the rash went away.   See flow sheet for details.  A/P: Pregnancy at 3665w1d.  Doing well.   Pregnancy issues include: UTI, Keflex 500 mg twice a day 7 days given on October 4. Test of cure not performed today as patient accidentally left before we could take her to the lab, patient will return on October 30 for test of cure. Called patient with spanish interpreter and informed her we will need to see her on Oct 30th.Future order placed for OB urine cx. Hypotension: BP 89/49 today, asymptomatic. Will return to clinic on October 30 for repeat BP. Return precautions discussed. Anatomy scan not done yet, filled out [employer] form and CMA scheduled anatomy scan for patient.  Preterm labor precautions reviewed. Follow up 4 weeks.  [facility]*Pacific Spanish interpreter Gershon Cullriscilla [phone] used for visit*

## 2016-03-16 ENCOUNTER — Ambulatory Visit (INDEPENDENT_AMBULATORY_CARE_PROVIDER_SITE_OTHER): Payer: Self-pay | Admitting: *Deleted

## 2016-03-16 ENCOUNTER — Other Ambulatory Visit: Payer: Self-pay

## 2016-03-16 VITALS — BP 112/70 | HR 106

## 2016-03-16 DIAGNOSIS — Z0130 Encounter for examination of blood pressure without abnormal findings: Secondary | ICD-10-CM

## 2016-03-16 NOTE — Progress Notes (Signed)
   Patient in nurse clinic for repeat blood pressure check.  Patient's blood pressure was 89/49 at last office visit.  Patient denies chest pain, headache, dizziness, numbness/tingling of arms,legs, fingers.  Patient also denies any pelvic pain or vaginal bleeding. Patient reported occasional shortness of breath with ambulation.  Advised patient to go to [facility]U if she develops any of the following symptoms.  Will forward to PCP.  Clovis PuMartin, Tamika L, RN  Today's Vitals   03/16/16 1010  BP: 112/70  Pulse: (!) 106  SpO2: 99%  PainSc: 0-No pain

## 2016-03-25 ENCOUNTER — Telehealth: Payer: Self-pay | Admitting: Family Medicine

## 2016-03-25 DIAGNOSIS — Z3492 Encounter for supervision of normal pregnancy, unspecified, second trimester: Secondary | ICD-10-CM

## 2016-03-25 DIAGNOSIS — R8271 Bacteriuria: Secondary | ICD-10-CM

## 2016-03-25 NOTE — Telephone Encounter (Signed)
[facility] called and said that the orders that were put into Epic on 03/10/16 are not correct. They noted this on 03/13/16 but seen that the orders are still pending. The orders need to be changed or sent to the correct department. jw

## 2016-03-26 ENCOUNTER — Encounter: Payer: Self-pay | Admitting: Family Medicine

## 2016-03-27 NOTE — Telephone Encounter (Signed)
Released the pended orders. Thanks.

## 2016-03-31 NOTE — Addendum Note (Signed)
Addended by: Lamonte SakaiZIMMERMAN RUMPLE, APRIL D on: 03/31/2016 04:45 PM   Modules accepted: Orders

## 2016-04-13 ENCOUNTER — Ambulatory Visit (INDEPENDENT_AMBULATORY_CARE_PROVIDER_SITE_OTHER): Payer: Self-pay | Admitting: Family Medicine

## 2016-04-13 VITALS — BP 106/50 | HR 91 | Temp 98.7°F | Wt 170.4 lb

## 2016-04-13 DIAGNOSIS — R300 Dysuria: Secondary | ICD-10-CM

## 2016-04-13 DIAGNOSIS — Z3402 Encounter for supervision of normal first pregnancy, second trimester: Secondary | ICD-10-CM

## 2016-04-13 LAB — POCT URINALYSIS DIPSTICK
BILIRUBIN UA: NEGATIVE
Blood, UA: NEGATIVE
Glucose, UA: NEGATIVE
KETONES UA: NEGATIVE
Nitrite, UA: NEGATIVE
PH UA: 7
PROTEIN UA: NEGATIVE
SPEC GRAV UA: 1.02
Urobilinogen, UA: 0.2

## 2016-04-13 NOTE — Progress Notes (Signed)
[name] is a 30 y.o. G4P3003 at 7349w6d for routine follow up.  She reports some fetal movement. Denies any contractions, cramps, vaginal discharge or spotting. Denies any dysuria.  See flow sheet for details.  A/P: Pregnancy at 6449w6d.  Doing well.   Pregnancy issues include: Previous UTI. Completed Keflex antibiotic course and is now asymptomatic. Will check urine today for test of cure as this was not performed last month. Anatomy scan reviewed, problems are not noted.  Preterm labor precautions reviewed. Follow up 4 weeks.  Anders Simmondshristina Gambino, MD [facility], PGY-2

## 2016-04-13 NOTE — Patient Instructions (Signed)
Thank you for coming in today, it was so nice to see you! Today we talked about:    Your pregnancy is going well! We will check your urine again today to make sure it is clear of bacteria.   Please follow up in 4 weeks. You can schedule this appointment at the front desk before you leave or call the clinic.  If you have any questions or concerns, please do not hesitate to call the office at [phone]. You can also message me directly via MyChart.   Sincerely,  Anders Simmondshristina Tannar Broker, MD   Gwynneth AlimentSegundo trimestre de [place]embarazo (Second Trimester of Pregnancy) El segundo trimestre va desde la semana13 hasta la 28, desde el cuarto hasta el sexto mes, y suele ser el momento en el que mejor se siente. En general, las nuseas matutinas han disminuido o han desaparecido completamente. Tendr ms energa y podr aumentarle el apetito. El beb por nacer (feto) se desarrolla rpidamente. Hacia el final del sexto mes, el beb mide aproximadamente 9 pulgadas (23 cm) y pesa alrededor de 1 libras (700 g). Es probable que sienta al beb moverse (dar pataditas) entre las 18 y 20 semanas del Psychiatristembarazo. CUIDADOS EN EL HOGAR  No fume, no consuma hierbas ni beba alcohol. No tome frmacos que el mdico no haya autorizado.  No consuma ningn producto que contenga tabaco, lo que incluye cigarrillos, tabaco de Theatre managermascar o Administrator, Civil Servicecigarrillos electrnicos. Si necesita ayuda para dejar de fumar, consulte al American Expressmdico. Puede recibir asesoramiento u otro tipo de apoyo para dejar de fumar.  Tome los medicamentos solamente como se lo haya indicado el mdico. Algunos medicamentos son seguros para tomar durante el Psychiatristembarazo y otros no lo son.  Haga ejercicios solamente como se lo haya indicado el mdico. Interrumpa la actividad fsica si comienza a tener calambres.  Ingiera alimentos saludables de Underwoodmanera regular.  Use un sostn que le brinde buen soporte si sus mamas estn sensibles.  No se d baos de inmersin en agua caliente, baos  turcos ni saunas.  Colquese el cinturn de seguridad cuando conduzca.  No coma carne cruda ni queso sin cocinar; evite el contacto con las bandejas sanitarias de los gatos y la tierra que estos animales usan.  Tome las vitaminas prenatales.  Tome entre 1500 y 2000mg  de calcio diariamente comenzando en la semana20 del embarazo [place] el parto.  Pruebe tomar un medicamento que la ayude a defecar (un laxante suave) si el mdico lo autoriza. Consuma ms fibra, que se encuentra en las frutas y verduras frescas y los cereales integrales. Beba suficiente lquido para mantener el pis (orina) claro o de color amarillo plido.  Dese baos de asiento con agua tibia para Engineer, materialsaliviar el dolor o las molestias causadas por las hemorroides. Use una crema para las hemorroides si el mdico la autoriza.  Si se le hinchan las venas (venas varicosas), use medias de descanso. Levante (eleve) los pies durante 15minutos, 3 o 4veces por Futures traderda. Limite el consumo de sal en su dieta.  No levante objetos pesados, use zapatos de tacones bajos y sintese derecha.  Descanse con las piernas elevadas si tiene calambres o dolor de cintura.  Visite a su dentista si no lo ha Occupational hygienisthecho durante el embarazo. Use un cepillo de cerdas suaves para cepillarse los dientes. Psese el hilo dental con suavidad.  Puede seguir Calpine Corporationmanteniendo relaciones sexuales, a menos que el mdico le indique lo contrario.  Concurra a los controles mdicos. SOLICITE AYUDA SI:  Siente mareos.  Sufre calambres o presin  leves en la parte baja del vientre (abdomen).  Sufre un dolor persistente en el abdomen.  Tiene Programme researcher, broadcasting/film/videomalestar estomacal (nuseas), vmitos, o tiene deposiciones acuosas (diarrea).  Advierte un olor ftido que proviene de la vagina.  Siente dolor al ConocoPhillipsorinar. SOLICITE AYUDA DE INMEDIATO SI:  Tiene fiebre.  Tiene una prdida de lquido por la vagina.  Tiene sangrado o pequeas prdidas vaginales.  Siente dolor intenso o clicos en el  abdomen.  Sube o baja de peso rpidamente.  Tiene dificultades para recuperar el aliento y siente dolor en el pecho.  Sbitamente se le hinchan mucho el rostro, las [place], los tobillos, los pies o las piernas.  No ha sentido los movimientos del beb durante Georgianne Fickuna hora.  Siente un dolor de cabeza intenso que no se alivia con medicamentos.  Su visin se modifica. Esta informacin no tiene Theme park managercomo fin reemplazar el consejo del mdico. Asegrese de hacerle al mdico cualquier pregunta que tenga. Document Released: 01/04/2013 Document Revised: 05/25/2014 Document Reviewed: 07/05/2012 Elsevier Interactive Patient Education  2017 [employer]

## 2016-04-15 LAB — CULTURE, OB URINE

## 2016-05-14 ENCOUNTER — Ambulatory Visit (INDEPENDENT_AMBULATORY_CARE_PROVIDER_SITE_OTHER): Payer: Self-pay | Admitting: Family Medicine

## 2016-05-14 VITALS — BP 100/60 | HR 98 | Temp 97.9°F | Wt 175.8 lb

## 2016-05-14 DIAGNOSIS — Z3493 Encounter for supervision of normal pregnancy, unspecified, third trimester: Secondary | ICD-10-CM

## 2016-05-14 DIAGNOSIS — Z6791 Unspecified blood type, Rh negative: Secondary | ICD-10-CM

## 2016-05-14 LAB — CBC WITH DIFFERENTIAL/PLATELET
BASOS ABS: 0 {cells}/uL (ref 0–200)
BASOS PCT: 0 %
EOS ABS: 83 {cells}/uL (ref 15–500)
Eosinophils Relative: 1 %
HCT: 32.2 % — ABNORMAL LOW (ref 35.0–45.0)
HEMOGLOBIN: 10.6 g/dL — AB (ref 11.7–15.5)
LYMPHS ABS: 1577 {cells}/uL (ref 850–3900)
Lymphocytes Relative: 19 %
MCH: 26.7 pg — AB (ref 27.0–33.0)
MCHC: 32.9 g/dL (ref 32.0–36.0)
MCV: 81.1 fL (ref 80.0–100.0)
MONO ABS: 581 {cells}/uL (ref 200–950)
MONOS PCT: 7 %
MPV: 11.5 fL (ref 7.5–12.5)
NEUTROS ABS: 6059 {cells}/uL (ref 1500–7800)
Neutrophils Relative %: 73 %
PLATELETS: 200 10*3/uL (ref 140–400)
RBC: 3.97 MIL/uL (ref 3.80–5.10)
RDW: 13.7 % (ref 11.0–15.0)
WBC: 8.3 10*3/uL (ref 3.8–10.8)

## 2016-05-14 MED ORDER — TETANUS-DIPHTH-ACELL PERTUSSIS 5-2.5-18.5 LF-MCG/0.5 IM SUSP: 0.5000 mL | Freq: Once | INTRAMUSCULAR | Status: DC

## 2016-05-14 MED ORDER — RHO D IMMUNE GLOBULIN 1500 UNIT/2ML IJ SOSY
300.0000 ug | PREFILLED_SYRINGE | Freq: Once | INTRAMUSCULAR | Status: AC
  Administered 2016-05-14: 300 ug via INTRAMUSCULAR

## 2016-05-14 MED ORDER — PRENATAL VITAMINS 0.8 MG PO TABS: 1.0000 | ORAL_TABLET | Freq: Every day | ORAL | 4 refills | Status: AC

## 2016-05-14 MED ORDER — RHO D IMMUNE GLOBULIN 1500 UNIT/2ML IJ SOSY: 300.0000 ug | PREFILLED_SYRINGE | Freq: Once | INTRAMUSCULAR | Status: DC

## 2016-05-14 NOTE — Progress Notes (Signed)
Rosendo GrosVeronica Liu is a 30 y.o. G4P3003 at [redacted]w[redacted]d for routine follow up. She reports no contractions, pain, vaginal bleeding, vaginal disharge. She reports positive fetal movement.  See flow sheet for details.  A/P: Pregnancy at 671w2d.  Doing well.   Pregnancy issues include UTI which has resolved after treatment. Had test of cure.  Infant feeding choice: Breast Contraception choice: OCPs Infant circumcision desired: No  Tdap was not given today. Patient will go to the health department to get this as it is more affordable for her there 1 hour glucola, antibody screen, CBC, RPR, and HIV were done today.   RH status was reviewed and pt does need Rhogam.  Rhogam was given today.   Childbirth and education classes were offered. Preterm labor precautions reviewed. Kick counts reviewed. Follow up 2 weeks.  *Pacific Spanish interpreter Victoria HesselbachMaria [phone] used for this visit*  Anders Simmondshristina Tharun Cappella, MD [facility], PGY-2

## 2016-05-14 NOTE — Patient Instructions (Addendum)
Follow up in 2 weeks  Come to the MAU (maternity admission unit) for 1) Strong contractions every 2-3 minutes for at least 1 hour that do no go away when you drink water or take a warm shower. These contractions will be so strong all you can do is breath through them 2) Vaginal bleeding- anything more than spotting 3) Loss of fluid like you broke your water 4) Decreased movement of your baby   Evaluacin de los movimientos fetales  (Fetal Movement Counts) Nombre del paciente: __________________________________________________ Victoria ChapmanFecha de parto estimada: ____________________ Caroleen HammanLa evaluacin de los movimientos fetales es muy recomendable en los embarazos de alto riesgo, pero tambin es una buena idea que lo hagan todas las Remingtonembarazadas. El Firefightermdico le indicar que comience a contarlos a las 28 semanas de Coaldaleembarazo. Los movimientos fetales suelen aumentar:   Despus de Animatoruna comida completa.  Despus de la actividad fsica.  Despus de comer o beber Graybar Electricalgo dulce o fro.  En reposo. Preste atencin cuando sienta que el beb est ms activo. Esto le ayudar a notar un patrn de ciclos de vigilia y sueo de su beb y cules son los factores que contribuyen a un aumento de los movimientos fetales. Es importante llevar a cabo un recuento de movimientos fetales, al mismo tiempo cada da, cuando el beb normalmente est ms activo.  CMO CONTAR LOS MOVIMIENTOS FETALES 1. Busque un lugar tranquilo y cmodo para sentarse o recostarse sobre el lado izquierdo. Al recostarse sobre su lado izquierdo, le proporciona una mejor circulacin de [place] Mes[place]angre y oxgeno al beb. 2. Anote el da y la hora en una hoja de papel o en un diario. 3. Comience contando las pataditas, revoloteos, chasquidos, vueltas o pinchazos en un perodo de 2 horas. Debe sentir al menos 10 movimientos en 2 horas. 4. Si no siente 10 movimientos en 2 horas, espere 2  3 horas y cuente de nuevo. Busque cambios en el patrn o si no cuenta lo suficiente en 2  horas. SOLICITE ATENCIN MDICA SI:   Siente menos de 10 pataditas en 2 horas, en dos intentos.  No hay movimientos durante una hora.  El patrn se modifica o le lleva ms tiempo Art gallery managercada da contar las 10 pataditas.  Siente que el beb no se mueve como lo hace habitualmente.   Tercer trimestre de Psychiatristembarazo (Third Trimester of Pregnancy) El tercer trimestre comprende desde la semana29 hasta la semana42, es decir, desde el mes7 hasta el [address]9. El tercer trimestre es un perodo en el que el feto crece rpidamente. Hacia el final del noveno mes, el feto mide alrededor de 20pulgadas (45cm) de largo y pesa entre 6 y 10 libras (2,700 y 844,500kg). CAMBIOS EN EL ORGANISMO Su organismo atraviesa por muchos cambios durante el [place] Villageembarazo, y estos varan de Neomia Dearuna mujer a Educational psychologistotra.  Seguir American Standard Companiesaumentando de peso. Es de esperar que aumente entre 25 y 35libras (11 y 16kg) hacia el final del Psychiatristembarazo.  Podrn aparecer las primeras Albertson'sestras en las caderas, el abdomen y las Custarmamas.  Puede tener necesidad de Geographical information systems officerorinar con ms frecuencia porque el feto baja hacia la pelvis y ejerce presin sobre la vejiga.  Debido al Vanetta Muldersembarazo podr sentir Anthoney Haradaacidez estomacal con frecuencia.  Puede estar estreida, ya que ciertas hormonas enlentecen los movimientos de los msculos que New York Life Insuranceempujan los desechos a travs de los intestinos.  Pueden aparecer hemorroides o abultarse e hincharse las venas (venas varicosas).  Puede sentir dolor plvico debido al Con-wayaumento de peso y a que las hormonas del Psychiatristembarazo  relajan las articulaciones [employer] de la pelvis. El dolor de espalda puede ser consecuencia de la sobrecarga de los msculos que soportan la [place].  Tal vez haya cambios en el cabello que pueden incluir su engrosamiento, crecimiento rpido y cambios en la textura. Adems, a algunas mujeres se les cae el cabello durante o despus del embarazo, o tienen el cabello seco o fino. Lo ms probable es que el cabello se le normalice despus  del nacimiento del beb.  Las ConAgra Foods seguirn creciendo y Development worker, community. A veces, puede haber una secrecin amarilla de las mamas llamada calostro.  El ombligo puede salir hacia afuera.  Puede sentir que le falta el aire debido a que se expande el tero.  Puede notar que el feto "baja" o lo siente ms bajo, en el abdomen.  Puede tener una prdida de secrecin mucosa con sangre. Esto suele ocurrir en el trmino de unos [address] a una semana antes de que comience el [place].  El cuello del tero se vuelve delgado y blando (se borra) cerca de la fecha de [place]. QU DEBE ESPERAR EN LOS EXMENES PRENATALES Le harn exmenes prenatales cada 2semanas hasta la semana36. A partir de ese momento le harn exmenes semanales. Durante una visita prenatal de rutina:  La pesarn para asegurarse de que usted y el feto estn creciendo normalmente.  Le tomarn la presin arterial.  Le medirn el abdomen para controlar el desarrollo del beb.  Se escucharn los latidos cardacos fetales.  Se evaluarn los resultados de los estudios solicitados en visitas anteriores.  Le revisarn el cuello del tero cuando est prxima la fecha de parto para controlar si este se ha borrado. Alrededor de la semana36, el mdico le revisar el cuello del tero. Al mismo tiempo, realizar un anlisis de las secreciones del tejido vaginal. Este examen es para determinar si hay un tipo de bacteria, estreptococo Grupo B. El mdico le explicar esto con ms detalle. El mdico puede preguntarle lo siguiente:  Cmo le gustara que fuera el [place].  Cmo se siente.  Si siente los movimientos del beb.  Si ha tenido sntomas anormales, como prdida de lquido, [place], dolores de cabeza intensos o clicos abdominales.  Si est consumiendo algn producto que contenga tabaco, como cigarrillos, tabaco de Theatre manager y Administrator, Civil Service.  Si tiene [employer]. Otros exmenes o estudios de deteccin que pueden  realizarse durante el tercer trimestre incluyen lo siguiente:  Anlisis de sangre para controlar los niveles de hierro (anemia).  Controles fetales para determinar su salud, nivel de [place] y Designer, jewellery. Si tiene [place] enfermedad o hay problemas durante el embarazo, le harn estudios.  Prueba del VIH (virus de inmunodeficiencia humana). Si corre Chiropodist, pueden realizarle una prueba de deteccin del VIH durante el tercer trimestre del embarazo. FALSO TRABAJO DE PARTO Es posible que sienta contracciones leves e irregulares que finalmente desaparecen. Se llaman contracciones de [address] o falso trabajo de [employer]. Las [employer] pueden durar horas, [address] o incluso semanas, antes de que el verdadero trabajo de parto se inicie. Si las contracciones ocurren a intervalos regulares, se intensifican o se hacen dolorosas, lo mejor es que la revise el mdico. SIGNOS DE TRABAJO DE PARTO  Clicos de tipo menstrual.  Contracciones cada o menos.  Contracciones que comienzan en la parte superior del tero y se extienden hacia abajo, a la zona inferior del abdomen y la espalda.  Sensacin de mayor presin en la pelvis o dolor de espalda.  Una secrecin de mucosidad acuosa o con sangre que sale de la vagina. Si tiene alguno de estos signos antes de la semana37 del Psychiatrist, llame a su mdico de inmediato. Debe concurrir al hospital para que la controlen inmediatamente. INSTRUCCIONES PARA EL CUIDADO EN EL HOGAR  Evite fumar, consumir hierbas, beber alcohol y tomar frmacos que no le hayan recetado. Estas sustancias qumicas afectan la formacin y el desarrollo del beb.  No consuma ningn producto que contenga tabaco, lo que incluye cigarrillos, tabaco de Theatre manager y Administrator, Civil Service. Si necesita ayuda para dejar de fumar, consulte al [employer]. Puede recibir asesoramiento y otro tipo de recursos para dejar de fumar.  Siga las indicaciones del mdico en relacin con el uso de  medicamentos. Durante el embarazo, hay medicamentos que son seguros de tomar y otros que no.  Haga ejercicio solamente como se lo haya indicado el mdico. Sentir clicos uterinos es un buen signo para Restaurant manager, fast food actividad fsica.  Contine comiendo alimentos sanos con regularidad.  Use un sostn que le brinde buen soporte si le [employer].  No se d baos de inmersin en agua caliente, baos turcos ni saunas.  Use el cinturn de seguridad en todo momento mientras conduce.  No coma carne cruda ni queso sin cocinar; evite el contacto con las bandejas sanitarias de los gatos y la tierra que estos animales usan. Estos elementos contienen grmenes que pueden causar defectos congnitos en el beb.  Tome las vitaminas prenatales.  Tome entre 1500 y 2000mg  de calcio diariamente comenzando en la semana20 del embarazo [place].  Si est estreida, pruebe un laxante suave (si el mdico lo autoriza). Consuma ms alimentos ricos en fibra, como vegetales y frutas frescos y Radiation protection practitioner. Beba gran cantidad de lquido para mantener la orina de tono claro o color amarillo plido.  Dese baos de asiento con agua tibia para Engineer, materials o las molestias causadas por las hemorroides. Use una crema para las hemorroides si el mdico la autoriza.  Si tiene venas varicosas, use medias de descanso. Eleve los pies durante , 3 o 4veces por da. Limite el consumo de sal en su dieta.  Evite levantar objetos pesados, use zapatos de tacones bajos y [place].  Descanse con las piernas elevadas si tiene calambres o dolor de cintura.  Visite a su dentista si no lo ha Occupational hygienist. Use un cepillo de dientes blando para higienizarse los dientes y psese el hilo dental con suavidad.  Puede seguir [employer], a menos que el mdico le indique lo contrario.  No haga viajes largos excepto que sea absolutamente necesario y solo con la  autorizacin del mdico.  Tome clases prenatales para Financial trader, Education administrator y hacer preguntas sobre el [place] de parto y [place].  Haga un ensayo de la partida al hospital.  Prepare el bolso que llevar al hospital.  Prepare la habitacin del beb.  Concurra a todas las visitas prenatales segn las indicaciones de su mdico. SOLICITE ATENCIN MDICA SI:  No est segura de que est en trabajo de parto o de que ha roto la bolsa de las aguas.  Tiene mareos.  Siente clicos leves, presin en la pelvis o dolor persistente en el abdomen.  Tiene nuseas, vmitos o diarrea persistentes.  Brett Fairy secrecin vaginal con mal olor.  Siente dolor al [employer]oco[employer]. SOLICITE ATENCIN MDICA DE INMEDIATO SI:  Tiene fiebre.  Tiene una prdida de lquido por la vagina.  Tiene sangrado o  pequeas prdidas vaginales.  Siente dolor intenso o clicos en el abdomen.  Sube o baja de peso rpidamente.  Tiene dificultad para respirar y siente dolor de pecho.  Sbitamente se le hinchan mucho el rostro, las Humboldtmanos, los tobillos, los pies o las piernas.  No ha sentido los movimientos del beb durante Georgianne Fickuna hora.  Siente un dolor de cabeza intenso que no se alivia con medicamentos.  Su visin se modifica. Esta informacin no tiene Theme park managercomo fin reemplazar el consejo del mdico. Asegrese de hacerle al mdico cualquier pregunta que tenga. Document Released: 02/11/2005 Document Revised: 05/25/2014 Document Reviewed: 07/05/2012 Elsevier Interactive Patient Education  2017 [employer]

## 2016-05-15 LAB — HIV ANTIBODY (ROUTINE TESTING W REFLEX): HIV 1&2 Ab, 4th Generation: NONREACTIVE

## 2016-05-15 LAB — GLUCOSE TOLERANCE, 1 HOUR (50G) W/O FASTING: GLUCOSE, 1 HR, GESTATIONAL: 99 mg/dL (ref <140)

## 2016-05-15 LAB — RPR

## 2016-05-15 LAB — ANTIBODY SCREEN: ANTIBODY SCREEN: NEGATIVE

## 2016-05-18 NOTE — L&D Delivery Note (Signed)
Vaginal Delivery Note  31 y.o. G4P4004 at 623w2d delivered a viable female infant at 04:41 in cephalic, LOA position. Loose nuchal cord x1. Right anterior shoulder delivered with ease. Sixty sec delayed cord clamping. Cord clamped x2 and cut. Placenta retained for 55 mins at which point manual extraction was performed. Dr. Despina HiddenEure was called to attend the delivery and removed retained tissue. Placenta with 3VC. Fundus firm on exam with massage and pitocin.  Mother: Anesthesia: Epidural Laceration: Labial x2 superficial Suture repair: None EBL: 500 mL  Baby: Apgars: 8, 9 Weight: 4110 g Cord pH: Not sent Placenta: Sent to pathology  Good hemostasis noted. Mom to postpartum.  Baby to Couplet care / Skin to Skin.  Wendee Beaversavid J McMullen, DO, PGY-1 07/30/2016, 6:01 AM   Patient is a [id number] at 743w2d who was admitted w/ SOL, uncomplicated prenatal course.  She progressed with augmentation via AROM.  I was gloved and present for delivery in its entirety.  Second stage of labor progressed to SVD.  Mild decels during second stage noted.  Complications: retained placenta requiring manual extraction; was initially given Pit bolus, later stopped, and then given oral cytotec 600mcg to encourage separation; after approx 40 mins, manual ext done and Dr Despina HiddenEure called to eval afterwards- no fragments identified  Lacerations: superficial labial x 2, no repair  EBL: 500cc  SHAW, KIMBERLY, CNM 4:04 PM

## 2016-06-02 ENCOUNTER — Ambulatory Visit (INDEPENDENT_AMBULATORY_CARE_PROVIDER_SITE_OTHER): Payer: Self-pay | Admitting: Family Medicine

## 2016-06-02 DIAGNOSIS — Z3402 Encounter for supervision of normal first pregnancy, second trimester: Secondary | ICD-10-CM

## 2016-06-02 MED ORDER — TETANUS-DIPHTH-ACELL PERTUSSIS 5-2.5-18.5 LF-MCG/0.5 IM SUSP: 0.5000 mL | Freq: Once | INTRAMUSCULAR | 0 refills | Status: AC

## 2016-06-02 MED ORDER — TETANUS-DIPHTHERIA TOXOIDS TD 5-2 LFU IM INJ: 0.5000 mL | INJECTION | Freq: Once | INTRAMUSCULAR | Status: DC

## 2016-06-02 NOTE — Patient Instructions (Signed)

## 2016-06-02 NOTE — Progress Notes (Signed)
Rosendo GrosVeronica Liu is a 31 y.o. G4P3003 at 5261w0d for routine follow up.  She reports no contractions, pain, vaginal bleeding, vaginal disharge. She reports positive fetal movement..  See flow sheet for details. Spanish interpreter used at this visit.   A/P: Pregnancy at 6261w0d.  Doing well.   Pregnancy issues include UTI that has resolved   Infant feeding choice: breast Contraception choice: OCPs Infant circumcision desired no  Tdapwas not given today. Patient given prescription to get at the health department. GBS/GC/CZ testing was not performed today.  Preterm labor precautions reviewed. Safe sleep discussed. Kick counts reviewed. Follow up 2 weeks.

## 2016-06-16 ENCOUNTER — Ambulatory Visit (INDEPENDENT_AMBULATORY_CARE_PROVIDER_SITE_OTHER): Payer: Self-pay | Admitting: Family Medicine

## 2016-06-16 VITALS — BP 100/60 | HR 98 | Temp 98.3°F | Wt 177.2 lb

## 2016-06-16 DIAGNOSIS — Z3403 Encounter for supervision of normal first pregnancy, third trimester: Secondary | ICD-10-CM

## 2016-06-16 NOTE — Progress Notes (Signed)
Rosendo GrosVeronica Liu is a 31 y.o. G4P3003 at 6431w0d for routine follow up.  She reports no acute concerns today. Denies vaginal bleeding, discharge, loss of fluid. Continues to note fetal movement. Denies headache, blurred vision, swelling. Does note history of Braxton Hicks contractions over the weekend but these have resolved.  See flow sheet for details.  A/P: Pregnancy at 2731w0d.  Doing well.   Pregnancy issues include Rh negativity. RhoGAM given 05/14/2016.  Will need GBS, gonorrhea, chlamydia screening at next visit.  Preterm labor precautions reviewed. Safe sleep discussed. Kick counts reviewed. Follow up 2 weeks.

## 2016-06-16 NOTE — Patient Instructions (Signed)
Tercer trimestre de embarazo (Third Trimester of Pregnancy) El tercer trimestre comprende desde la semana29 hasta la semana42, es decir, desde el mes7 hasta el mes9. El tercer trimestre es un perodo en el que el feto crece rpidamente. Hacia el final del noveno mes, el feto mide alrededor de 20pulgadas (45cm) de largo y pesa entre 6 y 10 libras (2,700 y 4,500kg). CAMBIOS EN EL ORGANISMO Su organismo atraviesa por muchos cambios durante el embarazo, y estos varan de una mujer a otra.  Seguir aumentando de peso. Es de esperar que aumente entre 25 y 35libras (11 y 16kg) hacia el final del embarazo.  Podrn aparecer las primeras estras en las caderas, el abdomen y las mamas.  Puede tener necesidad de orinar con ms frecuencia porque el feto baja hacia la pelvis y ejerce presin sobre la vejiga.  Debido al embarazo podr sentir acidez estomacal con frecuencia.  Puede estar estreida, ya que ciertas hormonas enlentecen los movimientos de los msculos que empujan los desechos a travs de los intestinos.  Pueden aparecer hemorroides o abultarse e hincharse las venas (venas varicosas).  Puede sentir dolor plvico debido al aumento de peso y a que las hormonas del embarazo relajan las articulaciones entre los huesos de la pelvis. El dolor de espalda puede ser consecuencia de la sobrecarga de los msculos que soportan la postura.  Tal vez haya cambios en el cabello que pueden incluir su engrosamiento, crecimiento rpido y cambios en la textura. Adems, a algunas mujeres se les cae el cabello durante o despus del embarazo, o tienen el cabello seco o fino. Lo ms probable es que el cabello se le normalice despus del nacimiento del beb.  Las mamas seguirn creciendo y le dolern. A veces, puede haber una secrecin amarilla de las mamas llamada calostro.  El ombligo puede salir hacia afuera.  Puede sentir que le falta el aire debido a que se expande el tero.  Puede notar que el feto  "baja" o lo siente ms bajo, en el abdomen.  Puede tener una prdida de secrecin mucosa con sangre. Esto suele ocurrir en el trmino de unos pocos das a una semana antes de que comience el trabajo de parto.  El cuello del tero se vuelve delgado y blando (se borra) cerca de la fecha de parto. QU DEBE ESPERAR EN LOS EXMENES PRENATALES Le harn exmenes prenatales cada 2semanas hasta la semana36. A partir de ese momento le harn exmenes semanales. Durante una visita prenatal de rutina:  La pesarn para asegurarse de que usted y el feto estn creciendo normalmente.  Le tomarn la presin arterial.  Le medirn el abdomen para controlar el desarrollo del beb.  Se escucharn los latidos cardacos fetales.  Se evaluarn los resultados de los estudios solicitados en visitas anteriores.  Le revisarn el cuello del tero cuando est prxima la fecha de parto para controlar si este se ha borrado. Alrededor de la semana36, el mdico le revisar el cuello del tero. Al mismo tiempo, realizar un anlisis de las secreciones del tejido vaginal. Este examen es para determinar si hay un tipo de bacteria, estreptococo Grupo B. El mdico le explicar esto con ms detalle. El mdico puede preguntarle lo siguiente:  Cmo le gustara que fuera el parto.  Cmo se siente.  Si siente los movimientos del beb.  Si ha tenido sntomas anormales, como prdida de lquido, sangrado, dolores de cabeza intensos o clicos abdominales.  Si est consumiendo algn producto que contenga tabaco, como cigarrillos, tabaco de mascar y   cigarrillos electrnicos.  Si tiene alguna pregunta. Otros exmenes o estudios de deteccin que pueden realizarse durante el tercer trimestre incluyen lo siguiente:  Anlisis de sangre para controlar los niveles de hierro (anemia).  Controles fetales para determinar su salud, nivel de actividad y crecimiento. Si tiene alguna enfermedad o hay problemas durante el embarazo, le harn  estudios.  Prueba del VIH (virus de inmunodeficiencia humana). Si corre un riesgo alto, pueden realizarle una prueba de deteccin del VIH durante el tercer trimestre del embarazo. FALSO TRABAJO DE PARTO Es posible que sienta contracciones leves e irregulares que finalmente desaparecen. Se llaman contracciones de Braxton Hicks o falso trabajo de parto. Las contracciones pueden durar horas, das o incluso semanas, antes de que el verdadero trabajo de parto se inicie. Si las contracciones ocurren a intervalos regulares, se intensifican o se hacen dolorosas, lo mejor es que la revise el mdico. SIGNOS DE TRABAJO DE PARTO  Clicos de tipo menstrual.  Contracciones cada 5minutos o menos.  Contracciones que comienzan en la parte superior del tero y se extienden hacia abajo, a la zona inferior del abdomen y la espalda.  Sensacin de mayor presin en la pelvis o dolor de espalda.  Una secrecin de mucosidad acuosa o con sangre que sale de la vagina. Si tiene alguno de estos signos antes de la semana37 del embarazo, llame a su mdico de inmediato. Debe concurrir al hospital para que la controlen inmediatamente. INSTRUCCIONES PARA EL CUIDADO EN EL HOGAR  Evite fumar, consumir hierbas, beber alcohol y tomar frmacos que no le hayan recetado. Estas sustancias qumicas afectan la formacin y el desarrollo del beb.  No consuma ningn producto que contenga tabaco, lo que incluye cigarrillos, tabaco de mascar y cigarrillos electrnicos. Si necesita ayuda para dejar de fumar, consulte al mdico. Puede recibir asesoramiento y otro tipo de recursos para dejar de fumar.  Siga las indicaciones del mdico en relacin con el uso de medicamentos. Durante el embarazo, hay medicamentos que son seguros de tomar y otros que no.  Haga ejercicio solamente como se lo haya indicado el mdico. Sentir clicos uterinos es un buen signo para detener la actividad fsica.  Contine comiendo alimentos sanos con  regularidad.  Use un sostn que le brinde buen soporte si le duelen las mamas.  No se d baos de inmersin en agua caliente, baos turcos ni saunas.  Use el cinturn de seguridad en todo momento mientras conduce.  No coma carne cruda ni queso sin cocinar; evite el contacto con las bandejas sanitarias de los gatos y la tierra que estos animales usan. Estos elementos contienen grmenes que pueden causar defectos congnitos en el beb.  Tome las vitaminas prenatales.  Tome entre 1500 y 2000mg de calcio diariamente comenzando en la semana20 del embarazo hasta el parto.  Si est estreida, pruebe un laxante suave (si el mdico lo autoriza). Consuma ms alimentos ricos en fibra, como vegetales y frutas frescos y cereales integrales. Beba gran cantidad de lquido para mantener la orina de tono claro o color amarillo plido.  Dese baos de asiento con agua tibia para aliviar el dolor o las molestias causadas por las hemorroides. Use una crema para las hemorroides si el mdico la autoriza.  Si tiene venas varicosas, use medias de descanso. Eleve los pies durante 15minutos, 3 o 4veces por da. Limite el consumo de sal en su dieta.  Evite levantar objetos pesados, use zapatos de tacones bajos y mantenga una buena postura.  Descanse con las piernas elevadas si tiene   calambres o dolor de cintura.  Visite a su dentista si no lo ha hecho durante el embarazo. Use un cepillo de dientes blando para higienizarse los dientes y psese el hilo dental con suavidad.  Puede seguir manteniendo relaciones sexuales, a menos que el mdico le indique lo contrario.  No haga viajes largos excepto que sea absolutamente necesario y solo con la autorizacin del mdico.  Tome clases prenatales para entender, practicar y hacer preguntas sobre el trabajo de parto y el parto.  Haga un ensayo de la partida al hospital.  Prepare el bolso que llevar al hospital.  Prepare la habitacin del beb.  Concurra a todas  las visitas prenatales segn las indicaciones de su mdico.  SOLICITE ATENCIN MDICA SI:  No est segura de que est en trabajo de parto o de que ha roto la bolsa de las aguas.  Tiene mareos.  Siente clicos leves, presin en la pelvis o dolor persistente en el abdomen.  Tiene nuseas, vmitos o diarrea persistentes.  Observa una secrecin vaginal con mal olor.  Siente dolor al orinar.  SOLICITE ATENCIN MDICA DE INMEDIATO SI:  Tiene fiebre.  Tiene una prdida de lquido por la vagina.  Tiene sangrado o pequeas prdidas vaginales.  Siente dolor intenso o clicos en el abdomen.  Sube o baja de peso rpidamente.  Tiene dificultad para respirar y siente dolor de pecho.  Sbitamente se le hinchan mucho el rostro, las manos, los tobillos, los pies o las piernas.  No ha sentido los movimientos del beb durante una hora.  Siente un dolor de cabeza intenso que no se alivia con medicamentos.  Su visin se modifica.  Esta informacin no tiene como fin reemplazar el consejo del mdico. Asegrese de hacerle al mdico cualquier pregunta que tenga. Document Released: 02/11/2005 Document Revised: 05/25/2014 Document Reviewed: 07/05/2012 Elsevier Interactive Patient Education  2017 Elsevier Inc.  

## 2016-07-02 ENCOUNTER — Ambulatory Visit (INDEPENDENT_AMBULATORY_CARE_PROVIDER_SITE_OTHER): Payer: Self-pay | Admitting: Family Medicine

## 2016-07-02 VITALS — BP 100/50 | HR 89 | Temp 98.3°F | Wt 177.4 lb

## 2016-07-02 DIAGNOSIS — N898 Other specified noninflammatory disorders of vagina: Secondary | ICD-10-CM

## 2016-07-02 DIAGNOSIS — Z3493 Encounter for supervision of normal pregnancy, unspecified, third trimester: Secondary | ICD-10-CM

## 2016-07-02 DIAGNOSIS — O26893 Other specified pregnancy related conditions, third trimester: Secondary | ICD-10-CM

## 2016-07-02 LAB — POCT WET PREP (WET MOUNT)
CLUE CELLS WET PREP WHIFF POC: NEGATIVE
Trichomonas Wet Prep HPF POC: ABSENT

## 2016-07-02 LAB — POCT FERNING: FERNING, POC: NEGATIVE

## 2016-07-02 MED ORDER — CLOTRIMAZOLE 1 % VA CREA: 1.0000 | TOPICAL_CREAM | Freq: Every day | VAGINAL | 0 refills | Status: DC

## 2016-07-02 NOTE — Patient Instructions (Signed)
Please schedule an appointment with Dr. Jonathon Jordan in one week.  Tercer trimestre de Psychiatrist (Third Trimester of Pregnancy) El tercer trimestre comprende desde la semana29 hasta la semana42, es decir, desde el mes7 hasta el [address] El tercer trimestre es un perodo en el que el feto crece rpidamente. Hacia el final del noveno mes, el feto mide alrededor de 20pulgadas (45cm) de largo y pesa entre 6 y 10 libras (2,700 y 25,500kg). CAMBIOS EN EL ORGANISMO Su organismo atraviesa por muchos cambios durante el [place], y estos varan de Neomia Dear mujer a Educational psychologist.  Seguir [employer]. Es de esperar que aumente entre 25 y 35libras (11 y 16kg) hacia el final del Psychiatrist.  Podrn aparecer las primeras [employer] caderas, el abdomen y las [place].  Puede tener necesidad de Geographical information systems officer con ms frecuencia porque el feto baja hacia la pelvis y ejerce presin sobre la vejiga.  Debido al Vanetta Mulders podr sentir Anthoney Harada estomacal con frecuencia.  Puede estar estreida, ya que ciertas hormonas enlentecen los movimientos de los msculos que [employer] desechos a travs de los intestinos.  Pueden aparecer hemorroides o abultarse e hincharse las venas (venas varicosas).  Puede sentir dolor plvico debido al [employer] y a que las hormonas del Management consultant las articulaciones entre los huesos de la pelvis. El dolor de espalda puede ser consecuencia de la sobrecarga de los msculos que soportan la [place].  Tal vez haya cambios en el cabello que pueden incluir su engrosamiento, crecimiento rpido y cambios en la textura. Adems, a algunas mujeres se les cae el cabello durante o despus del embarazo, o tienen el cabello seco o fino. Lo ms probable es que el cabello se le normalice despus del nacimiento del beb.  Las [employer] seguirn creciendo y Development worker, community. A veces, puede haber una secrecin amarilla de las mamas llamada calostro.  El ombligo puede salir hacia afuera.  Puede sentir que le falta el aire  debido a que se expande el tero.  Puede notar que el feto "baja" o lo siente ms bajo, en el abdomen.  Puede tener una prdida de secrecin mucosa con sangre. Esto suele ocurrir en el trmino de unos [address] a una semana antes de que comience el [place].  El cuello del tero se vuelve delgado y blando (se borra) cerca de la fecha de [place]. QU DEBE ESPERAR EN LOS EXMENES PRENATALES Le harn exmenes prenatales cada 2semanas hasta la semana36. A partir de ese momento le harn exmenes semanales. Durante una visita prenatal de rutina:  La pesarn para asegurarse de que usted y el feto estn creciendo normalmente.  Le tomarn la presin arterial.  Le medirn el abdomen para controlar el desarrollo del beb.  Se escucharn los latidos cardacos fetales.  Se evaluarn los resultados de los estudios solicitados en visitas anteriores.  Le revisarn el cuello del tero cuando est prxima la fecha de parto para controlar si este se ha borrado. Alrededor de la semana36, el mdico le revisar el cuello del tero. Al mismo tiempo, realizar un anlisis de las secreciones del tejido vaginal. Este examen es para determinar si hay un tipo de bacteria, estreptococo Grupo B. El mdico le explicar esto con ms detalle. El mdico puede preguntarle lo siguiente:  Cmo le gustara que fuera el [place].  Cmo se siente.  Si siente los movimientos del beb.  Si ha tenido sntomas anormales, como prdida de lquido, [place], dolores de cabeza intensos o clicos abdominales.  Si est consumiendo  algn producto que contenga tabaco, como cigarrillos, tabaco de Theatre manager y Administrator, Civil Service.  Si tiene [employer]. Otros exmenes o estudios de deteccin que pueden realizarse durante el tercer trimestre incluyen lo siguiente:  Anlisis de sangre para controlar los niveles de hierro (anemia).  Controles fetales para determinar su salud, nivel de [place] y Designer, jewellery. Si tiene  [place] enfermedad o hay problemas durante el embarazo, le harn estudios.  Prueba del VIH (virus de inmunodeficiencia humana). Si corre Chiropodist, pueden realizarle una prueba de deteccin del VIH durante el tercer trimestre del embarazo. FALSO TRABAJO DE PARTO Es posible que sienta contracciones leves e irregulares que finalmente desaparecen. Se llaman contracciones de [address] o falso trabajo de [place]. Las [employer] pueden durar horas, [address] o incluso semanas, antes de que el verdadero trabajo de parto se inicie. Si las contracciones ocurren a intervalos regulares, se intensifican o se hacen dolorosas, lo mejor es que la revise el mdico. SIGNOS DE TRABAJO DE PARTO  Clicos de tipo menstrual.  Contracciones cada o menos.  Contracciones que comienzan en la parte superior del tero y se extienden hacia abajo, a la zona inferior del abdomen y la espalda.  Sensacin de mayor presin en la pelvis o dolor de espalda.  Una secrecin de mucosidad acuosa o con sangre que sale de la vagina. Si tiene alguno de estos signos antes de la semana37 del Psychiatrist, llame a su mdico de inmediato. Debe concurrir al hospital para que la controlen inmediatamente. INSTRUCCIONES PARA EL CUIDADO EN EL HOGAR  Evite fumar, consumir hierbas, beber alcohol y tomar frmacos que no le hayan recetado. Estas sustancias qumicas afectan la formacin y el desarrollo del beb.  No consuma ningn producto que contenga tabaco, lo que incluye cigarrillos, tabaco de Theatre manager y Administrator, Civil Service. Si necesita ayuda para dejar de fumar, consulte al [employer]. Puede recibir asesoramiento y otro tipo de recursos para dejar de fumar.  Siga las indicaciones del mdico en relacin con el uso de medicamentos. Durante el embarazo, hay medicamentos que son seguros de tomar y otros que no.  Haga ejercicio solamente como se lo haya indicado el mdico. Sentir clicos uterinos es un buen signo para Restaurant manager, fast food  actividad fsica.  Contine comiendo alimentos sanos con regularidad.  Use un sostn que le brinde buen soporte si le [employer].  No se d baos de inmersin en agua caliente, baos turcos ni saunas.  Use el cinturn de seguridad en todo momento mientras conduce.  No coma carne cruda ni queso sin cocinar; evite el contacto con las bandejas sanitarias de los gatos y la tierra que estos animales usan. Estos elementos contienen grmenes que pueden causar defectos congnitos en el beb.  Tome las vitaminas prenatales.  Tome entre 1500 y 2000mg  de calcio diariamente comenzando en la semana20 del embarazo [place].  Si est estreida, pruebe un laxante suave (si el mdico lo autoriza). Consuma ms alimentos ricos en fibra, como vegetales y frutas frescos y Radiation protection practitioner. Beba gran cantidad de lquido para mantener la orina de tono claro o color amarillo plido.  Dese baos de asiento con agua tibia para Engineer, materials o las molestias causadas por las hemorroides. Use una crema para las hemorroides si el mdico la autoriza.  Si tiene venas varicosas, use medias de descanso. Eleve los pies durante , 3 o 4veces por da. Limite el consumo de sal en su dieta.  Evite levantar objetos pesados, use zapatos de tacones bajos y [address]  una buena postura.  Descanse con las piernas elevadas si tiene calambres o dolor de cintura.  Visite a su dentista si no lo ha Occupational hygienisthecho durante el embarazo. Use un cepillo de dientes blando para higienizarse los dientes y psese el hilo dental con suavidad.  Puede seguir Calpine Corporationmanteniendo relaciones sexuales, a menos que el mdico le indique lo contrario.  No haga viajes largos excepto que sea absolutamente necesario y solo con la autorizacin del mdico.  Tome clases prenatales para Financial traderentender, Education administratorpracticar y hacer preguntas sobre el Crab Orchardtrabajo de parto y Maybeeel parto.  Haga un ensayo de la partida al hospital.  Prepare el bolso que llevar al  hospital.  Prepare la habitacin del beb.  Concurra a todas las visitas prenatales segn las indicaciones de su mdico. SOLICITE ATENCIN MDICA SI:  No est segura de que est en trabajo de parto o de que ha roto la bolsa de las aguas.  Tiene mareos.  Siente clicos leves, presin en la pelvis o dolor persistente en el abdomen.  Tiene nuseas, vmitos o diarrea persistentes.  Brett Fairybserva una secrecin vaginal con mal olor.  Siente dolor al ConocoPhillipsorinar. SOLICITE ATENCIN MDICA DE INMEDIATO SI:  Tiene fiebre.  Tiene una prdida de lquido por la vagina.  Tiene sangrado o pequeas prdidas vaginales.  Siente dolor intenso o clicos en el abdomen.  Sube o baja de peso rpidamente.  Tiene dificultad para respirar y siente dolor de pecho.  Sbitamente se le hinchan mucho el rostro, las [place], los tobillos, los pies o las piernas.  No ha sentido los movimientos del beb durante Georgianne Fickuna hora.  Siente un dolor de cabeza intenso que no se alivia con medicamentos.  Su visin se modifica. Esta informacin no tiene Theme park managercomo fin reemplazar el consejo del mdico. Asegrese de hacerle al mdico cualquier pregunta que tenga. Document Released: 02/11/2005 Document Revised: 05/25/2014 Document Reviewed: 07/05/2012 Elsevier Interactive Patient Education  2017 [employer]

## 2016-07-03 ENCOUNTER — Telehealth (HOSPITAL_COMMUNITY): Payer: Self-pay | Admitting: *Deleted

## 2016-07-03 ENCOUNTER — Encounter (HOSPITAL_COMMUNITY): Payer: Self-pay | Admitting: *Deleted

## 2016-07-03 LAB — STREP B DNA PROBE: GBSP: NOT DETECTED

## 2016-07-03 LAB — CERVICOVAGINAL ANCILLARY ONLY
CHLAMYDIA, DNA PROBE: NEGATIVE
Neisseria Gonorrhea: NEGATIVE

## 2016-07-03 NOTE — Telephone Encounter (Signed)
Preadmission screen Interpreter number [phone]

## 2016-07-05 NOTE — Progress Notes (Signed)
Rosendo GrosVeronica Liu is a [age] y.o. G4P3003 at 843w5d for routine follow up.  She reports some vaginal discharge and mild itching. Notes fetal movement. Reports occasional leakage of fluid for last two weeks; believes it may be urine but unsure. Denies vaginal bleeding or contractions. Denies blurred vision, swelling, or headache.  See flow sheet for details.  A/P: Pregnancy at 2443w5d.  Doing well.   Sterile speculum exam with white discharge sticking to vaginal walls. Wet prep with yeast. Fern negative. Prescription for vaginal Clotrimazole given.  US with baby in breeched position. [facility], to arrive by 8am.   GBS/GC/CZ testing was performed today.  Preterm labor precautions reviewed. Safe sleep discussed. Kick counts reviewed. Follow up 1 weeks.

## 2016-07-06 ENCOUNTER — Observation Stay (HOSPITAL_COMMUNITY)
Admission: RE | Admit: 2016-07-06 | Discharge: 2016-07-06 | Disposition: A | Discharge status: Home / Self Care | Payer: Self-pay | Source: Ambulatory Visit | Attending: Obstetrics & Gynecology | Admitting: Obstetrics & Gynecology

## 2016-07-06 ENCOUNTER — Encounter (HOSPITAL_COMMUNITY): Payer: Self-pay

## 2016-07-06 DIAGNOSIS — O321XX0 Maternal care for breech presentation, not applicable or unspecified: Principal | ICD-10-CM | POA: Diagnosis present

## 2016-07-06 DIAGNOSIS — Z3A36 36 weeks gestation of pregnancy: Secondary | ICD-10-CM | POA: Insufficient documentation

## 2016-07-06 MED ORDER — TERBUTALINE SULFATE 1 MG/ML IJ SOLN
INTRAMUSCULAR | Status: AC
  Filled 2016-07-06: qty 1

## 2016-07-06 MED ORDER — LACTATED RINGERS IV SOLN
INTRAVENOUS | Status: DC
  Administered 2016-07-06: 08:00:00 via INTRAVENOUS

## 2016-07-06 MED ORDER — TERBUTALINE SULFATE 1 MG/ML IJ SOLN
0.2500 mg | Freq: Once | INTRAMUSCULAR | Status: AC
  Administered 2016-07-06: 0.25 mg via SUBCUTANEOUS

## 2016-07-06 NOTE — Progress Notes (Signed)
External Cephalic Version  Preprocedure Diagnosis:  31 y.o. [id number] 6566w6d with breech presentation  Post-procedure Diagnosis: 31 y.o. G4P3 at 8036.[redacted] weeks gestational age with cephalic presentation  Procedure:  Successful external cephalic version  Procedure in detail:   The patient was brought to Labor and Delivery where a reactive fetal heart tracing was obtained. The patient was noted to have irregular contractions. She was given 1 dose of subcutaneous terbutaline which resolved her contraction. A bedside ultrasound was performed which revealed single intrauterine pregnancy and breech presentation. There was noted to be adequate fluid. Using manual pressure, the breech was manipulated in a forward roll fashion until a vertex presentation was obtained. Fetal heart tones were checked intermittently during the procedure and were noted to be reassuring. Following successful external cephalic version, the patient was placed on continuous external fetal monitoring.   Adam PhenixJames G Anaaya Fuster, MD 07/06/2016 9:56 AM

## 2016-07-06 NOTE — Discharge Instructions (Signed)
[facility] 07/10/16

## 2016-07-06 NOTE — Discharge Summary (Signed)
Physician Discharge Summary  Patient ID: Rosendo GrosVeronica Liu MRN: [id number] DOB/AGE: [birth year] 31 y.o.  Admit date: 07/06/2016 Discharge date: 07/06/2016  Admission Diagnoses:breech  Discharge Diagnoses:  Active Problems:   Breech presentation   Discharged Condition: good  Hospital Course: External Cephalic Version   Procedure in detail:   The patient was brought to Labor and Delivery where a reactive fetal heart tracing was obtained. The patient was noted to have irregular contractions. She was given 1 dose of subcutaneous terbutaline which resolved her contraction. A bedside ultrasound was performed which revealed single intrauterine pregnancy and breech presentation. There was noted to be adequate fluid. Using manual pressure, the breech was manipulated in a forward roll fashion until a vertex presentation was obtained. Fetal heart tones were checked intermittently during the procedure and were noted to be reassuring. Following successful external cephalic version, the patient was placed on continuous external fetal monitoring. She was noted to have a reassuring and reactive tracing for 1 hour following the external cephalic version. She did not have regular contractions and therefore she was felt to be stable for discharge to home. She was given appropriate labor instructions.   Consults: None  Significant Diagnostic Studies: radiology: Ultrasound: at bedside  Treatments: IV hydration and terbutaline  Discharge Exam: Blood pressure 107/64, pulse 97, temperature 98.9 F (37.2 C), temperature source Oral, height 5\' 4"  (1.626 m), weight 177 lb (80.3 kg), last menstrual period 10/22/2015, unknown if currently breastfeeding. General appearance: alert, cooperative and no distress  Disposition: 01-Home or Self Care    Follow-up Information    [facility]   Specialty:  Family Medicine Contact information: [address] [id number] [place] [id number] [phone]          Signed: Scheryl DarterJames Arnold 07/06/2016, 10:53 AM

## 2016-07-06 NOTE — H&P (Signed)
Victoria Liu is a 31 y.o. female presenting for ECV for breech presenta. OB History    Gravida Para Term Preterm AB Living   4 3 3     3    SAB TAB Ectopic Multiple Live Births           3     Past Medical History:  Diagnosis Date  . Medical history non-contributory    Past Surgical History:  Procedure Laterality Date  . NO PAST SURGERIES     Family History: family history is not on file. Social History:  reports that she has never smoked. She has never used smokeless tobacco. She reports that she does not drink alcohol or use drugs.     Maternal Diabetes: No Genetic Screening: Normal Maternal Ultrasounds/Referrals: Normal Fetal Ultrasounds or other Referrals:  None Maternal Substance Abuse:  No Significant Maternal Medications:  None Significant Maternal Lab Results:  None Other Comments:  breech  Review of Systems  Constitutional: Negative.   Cardiovascular: Negative.   Genitourinary: Negative.   Psychiatric/Behavioral: Negative.    History   Blood pressure 107/64, pulse 97, temperature 98.9 F (37.2 C), temperature source Oral, height 5\' 4"  (1.626 m), weight 177 lb (80.3 kg), last menstrual period 10/22/2015, unknown if currently breastfeeding. Exam Physical Exam  Constitutional: She appears well-developed.  Respiratory: Effort normal.  Genitourinary:  Genitourinary Comments: [place] shows adequate fluid and breech presentation  Psychiatric: She has a normal mood and affect. Her behavior is normal.    Prenatal labs: ABO, Rh: B/NEG/-- (09/20 1023) Antibody: NEG (12/28 1547) Rubella: 6.30 (09/20 1023) RPR: NON REAC (12/28 1550)  HBsAg: NEGATIVE (09/20 1023)  HIV: NONREACTIVE (12/28 1550)  GBS: NOT DETECTED (02/15 1347)   Assessment/Plan: Breech presentation for ECV. External Cephalic Version  The risk of failure, pain , ROM, emergency delivery [facility] present. Victoria Liu 07/06/2016, 9:08 AM

## 2016-07-10 ENCOUNTER — Ambulatory Visit (INDEPENDENT_AMBULATORY_CARE_PROVIDER_SITE_OTHER): Payer: Self-pay | Admitting: Family Medicine

## 2016-07-10 DIAGNOSIS — Z3403 Encounter for supervision of normal first pregnancy, third trimester: Secondary | ICD-10-CM

## 2016-07-10 NOTE — Progress Notes (Signed)
Rosendo GrosVeronica Liu is a 31 y.o. G4P3003 at 8658w3d for routine follow up.  She reports that her version was painful at the time but she is feeling well now. Denies vaginal bleeding, discharge, loss of fluid. Continues to note fetal movement. Denies headache, blurred vision, swelling.  See flow sheet for details.  A/P: Pregnancy at 3058w3d.  Doing well.   Pregnancy issues include UTI in pregnancy which resolved after treatment. Also breech presentation. [facility] without any complications.   Infant feeding choice: breast Contraception choice: OCPs Infant circumcision desired no GBS/GC/CZ results were reviewed today.   Labor precautions reviewed. Kick counts reviewed. Follow up in 1 week.

## 2016-07-10 NOTE — Progress Notes (Signed)
100/58

## 2016-07-10 NOTE — Patient Instructions (Signed)
Follow up in 1 week  Tercer trimestre de Psychiatristembarazo (Third Trimester of Pregnancy) El tercer trimestre comprende desde la semana29 hasta la semana42, es decir, desde el mes7 hasta el [address]. El tercer trimestre es un perodo en el que el feto crece rpidamente. Hacia el final del noveno mes, el feto mide alrededor de 20pulgadas (45cm) de largo y pesa entre 6 y 10 libras (2,700 y 714,500kg). CAMBIOS EN EL ORGANISMO Su organismo atraviesa por muchos cambios durante el Gallipolisembarazo, y estos varan de Neomia Dearuna mujer a Educational psychologistotra.  Seguir American Standard Companiesaumentando de peso. Es de esperar que aumente entre 25 y 35libras (11 y 16kg) hacia el final del Psychiatristembarazo.  Podrn aparecer las primeras Albertson'sestras en las caderas, el abdomen y las [place] Shoresmamas.  Puede tener necesidad de Geographical information systems officerorinar con ms frecuencia porque el feto baja hacia la pelvis y ejerce presin sobre la vejiga.  Debido al Vanetta Muldersembarazo podr sentir Anthoney Haradaacidez estomacal con frecuencia.  Puede estar estreida, ya que ciertas hormonas enlentecen los movimientos de los msculos que New York Life Insuranceempujan los desechos a travs de los intestinos.  Pueden aparecer hemorroides o abultarse e hincharse las venas (venas varicosas).  Puede sentir dolor plvico debido al Con-wayaumento de peso y a que las hormonas del Management consultantembarazo relajan las articulaciones entre los huesos de la pelvis. El dolor de espalda puede ser consecuencia de la sobrecarga de los msculos que soportan la Duboispostura.  Tal vez haya cambios en el cabello que pueden incluir su engrosamiento, crecimiento rpido y cambios en la textura. Adems, a algunas mujeres se les cae el cabello durante o despus del embarazo, o tienen el cabello seco o fino. Lo ms probable es que el cabello se le normalice despus del nacimiento del beb.  Las ConAgra Foodsmamas seguirn creciendo y Development worker, communityle dolern. A veces, puede haber una secrecin amarilla de las mamas llamada calostro.  El ombligo puede salir hacia afuera.  Puede sentir que le falta el aire debido a que se expande el  tero.  Puede notar que el feto "baja" o lo siente ms bajo, en el abdomen.  Puede tener una prdida de secrecin mucosa con sangre. Esto suele ocurrir en el trmino de unos [address]os das a una semana antes de que comience el Biscaytrabajo de Crystal Lakesparto.  El cuello del tero se vuelve delgado y blando (se borra) cerca de la fecha de Aubreyparto. QU DEBE ESPERAR EN LOS EXMENES PRENATALES Le harn exmenes prenatales cada 2semanas hasta la semana36. A partir de ese momento le harn exmenes semanales. Durante una visita prenatal de rutina:  La pesarn para asegurarse de que usted y el feto estn creciendo normalmente.  Le tomarn la presin arterial.  Le medirn el abdomen para controlar el desarrollo del beb.  Se escucharn los latidos cardacos fetales.  Se evaluarn los resultados de los estudios solicitados en visitas anteriores.  Le revisarn el cuello del tero cuando est prxima la fecha de parto para controlar si este se ha borrado. Alrededor de la semana36, el mdico le revisar el cuello del tero. Al mismo tiempo, realizar un anlisis de las secreciones del tejido vaginal. Este examen es para determinar si hay un tipo de bacteria, estreptococo Grupo B. El mdico le explicar esto con ms detalle. El mdico puede preguntarle lo siguiente:  Cmo le gustara que fuera el Thomasparto.  Cmo se siente.  Si siente los movimientos del beb.  Si ha tenido sntomas anormales, como prdida de lquido, Snydersangrado, dolores de cabeza intensos o clicos abdominales.  Si est consumiendo algn producto que contenga tabaco,  como cigarrillos, tabaco de Theatre managermascar y Administrator, Civil Servicecigarrillos electrnicos.  Si tiene Colgate-Palmolivealguna pregunta. Otros exmenes o estudios de deteccin que pueden realizarse durante el tercer trimestre incluyen lo siguiente:  Anlisis de sangre para controlar los niveles de hierro (anemia).  Controles fetales para determinar su salud, nivel de Saint Vincent and the Grenadinesactividad y Designer, jewellerycrecimiento. Si tiene Jerseyalguna enfermedad o hay  problemas durante el embarazo, le harn estudios.  Prueba del VIH (virus de inmunodeficiencia humana). Si corre Chiropodistun riesgo alto, pueden realizarle una prueba de deteccin del VIH durante el tercer trimestre del embarazo. FALSO TRABAJO DE PARTO Es posible que sienta contracciones leves e irregulares que finalmente desaparecen. Se llaman contracciones de 1000 [address] StreetBraxton Hicks o falso trabajo de Onstedparto. Las Fifth Third Bancorpcontracciones pueden durar horas, [address]  [address]das o incluso semanas, antes de que el verdadero trabajo de parto se inicie. Si las contracciones ocurren a intervalos regulares, se intensifican o se hacen dolorosas, lo mejor es que la revise el mdico. SIGNOS DE TRABAJO DE PARTO  Clicos de tipo menstrual.  Contracciones cada 5minutos o menos.  Contracciones que comienzan en la parte superior del tero y se extienden hacia abajo, a la zona inferior del abdomen y la espalda.  Sensacin de mayor presin en la pelvis o dolor de espalda.  Una secrecin de mucosidad acuosa o con sangre que sale de la vagina. Si tiene alguno de estos signos antes de la semana37 del Psychiatristembarazo, llame a su mdico de inmediato. Debe concurrir al hospital para que la controlen inmediatamente. INSTRUCCIONES PARA EL CUIDADO EN EL HOGAR  Evite fumar, consumir hierbas, beber alcohol y tomar frmacos que no le hayan recetado. Estas sustancias qumicas afectan la formacin y el desarrollo del beb.  No consuma ningn producto que contenga tabaco, lo que incluye cigarrillos, tabaco de Theatre managermascar y Administrator, Civil Servicecigarrillos electrnicos. Si necesita ayuda para dejar de fumar, consulte al American Expressmdico. Puede recibir asesoramiento y otro tipo de recursos para dejar de fumar.  Siga las indicaciones del mdico en relacin con el uso de medicamentos. Durante el embarazo, hay medicamentos que son seguros de tomar y otros que no.  Haga ejercicio solamente como se lo haya indicado el mdico. Sentir clicos uterinos es un buen signo para Restaurant manager, fast fooddetener la actividad fsica.  Contine  comiendo alimentos sanos con regularidad.  Use un sostn que le brinde buen soporte si le Altria Groupduelen las mamas.  No se d baos de inmersin en agua caliente, baos turcos ni saunas.  Use el cinturn de seguridad en todo momento mientras conduce.  No coma carne cruda ni queso sin cocinar; evite el contacto con las bandejas sanitarias de los gatos y la tierra que estos animales usan. Estos elementos contienen grmenes que pueden causar defectos congnitos en el beb.  Tome las vitaminas prenatales.  Tome entre 1500 y 2000mg  de calcio diariamente comenzando en la semana20 del embarazo Iaegerhasta el parto.  Si est estreida, pruebe un laxante suave (si el mdico lo autoriza). Consuma ms alimentos ricos en fibra, como vegetales y frutas frescos y Radiation protection practitionercereales integrales. Beba gran cantidad de lquido para mantener la orina de tono claro o color amarillo plido.  Dese baos de asiento con agua tibia para Engineer, materialsaliviar el dolor o las molestias causadas por las hemorroides. Use una crema para las hemorroides si el mdico la autoriza.  Si tiene venas varicosas, use medias de descanso. Eleve los pies durante 15minutos, 3 o 4veces por da. Limite el consumo de sal en su dieta.  Evite levantar objetos pesados, use zapatos de tacones bajos y Brazilmantenga una buena postura.  Descanse  con las piernas elevadas si tiene calambres o dolor de cintura.  Visite a su dentista si no lo ha Occupational hygienist. Use un cepillo de dientes blando para higienizarse los dientes y psese el hilo dental con suavidad.  Puede seguir Cal[employer] Corporation, a menos que el mdico le indique lo contrario.  No haga viajes largos excepto que sea absolutamente necesario y solo con la autorizacin del mdico.  Tome clases prenatales para Financial trader, Education administrator y hacer preguntas sobre el [place] de parto y [place].  Haga un ensayo de la partida al hospital.  Prepare el bolso que llevar al hospital.  Prepare la habitacin  del beb.  Concurra a todas las visitas prenatales segn las indicaciones de su mdico. SOLICITE ATENCIN MDICA SI:  No est segura de que est en trabajo de parto o de que ha roto la bolsa de las aguas.  Tiene mareos.  Siente clicos leves, presin en la pelvis o dolor persistente en el abdomen.  Tiene nuseas, vmitos o diarrea persistentes.  Brett Fairy secrecin vaginal con mal olor.  Siente dolor al ConocoPhillips. SOLICITE ATENCIN MDICA DE INMEDIATO SI:  Tiene fiebre.  Tiene una prdida de lquido por la vagina.  Tiene sangrado o pequeas prdidas vaginales.  Siente dolor intenso o clicos en el abdomen.  Sube o baja de peso rpidamente.  Tiene dificultad para respirar y siente dolor de pecho.  Sbitamente se le hinchan mucho el rostro, las [place], los tobillos, los pies o las piernas.  No ha sentido los movimientos del beb durante Georgianne Fick.  Siente un dolor de cabeza intenso que no se alivia con medicamentos.  Su visin se modifica. Esta informacin no tiene Theme park manager el consejo del mdico. Asegrese de hacerle al mdico cualquier pregunta que tenga. Document Released: 02/11/2005 Document Revised: 05/25/2014 Document Reviewed: 07/05/2012 Elsevier Interactive Patient Education  2017 ArvinMer[employer].

## 2016-07-20 ENCOUNTER — Ambulatory Visit (INDEPENDENT_AMBULATORY_CARE_PROVIDER_SITE_OTHER): Payer: Self-pay | Admitting: Family Medicine

## 2016-07-20 VITALS — BP 102/68 | HR 86 | Temp 98.4°F | Wt 178.6 lb

## 2016-07-20 DIAGNOSIS — Z3493 Encounter for supervision of normal pregnancy, unspecified, third trimester: Secondary | ICD-10-CM

## 2016-07-20 NOTE — Progress Notes (Signed)
Rosendo GrosVeronica Liu is a 31 y.o. G4P3 at 5049w6d for routine follow up. Visit conducted with aid of Spanish interpretor. She reports no acute concerns today, denying vaginal bleeding, vaginal discharge, or loss of fluid. Does note some Braxton Hicks Contractions occasionally. Continues to note fetal movement.  See flow sheet for details.  A/P: Pregnancy at 4449w6d.  Doing well.   Pregnancy issues include breech, with Version on 07/06/16. Still cephalic on US today.  Infant feeding choice Breast Contraception choice OCPs Infant circumcision desired no GBS/GC/CZ results were reviewed today.   Labor precautions reviewed. Kick counts reviewed. Return in one week.

## 2016-07-20 NOTE — Patient Instructions (Signed)
Tercer trimestre de embarazo (Third Trimester of Pregnancy) El tercer trimestre comprende desde la semana29 hasta la semana42, es decir, desde el mes7 hasta el mes9. El tercer trimestre es un perodo en el que el feto crece rpidamente. Hacia el final del noveno mes, el feto mide alrededor de 20pulgadas (45cm) de largo y pesa entre 6 y 10 libras (2,700 y 4,500kg). CAMBIOS EN EL ORGANISMO Su organismo atraviesa por muchos cambios durante el embarazo, y estos varan de una mujer a otra.  Seguir aumentando de peso. Es de esperar que aumente entre 25 y 35libras (11 y 16kg) hacia el final del embarazo.  Podrn aparecer las primeras estras en las caderas, el abdomen y las mamas.  Puede tener necesidad de orinar con ms frecuencia porque el feto baja hacia la pelvis y ejerce presin sobre la vejiga.  Debido al embarazo podr sentir acidez estomacal con frecuencia.  Puede estar estreida, ya que ciertas hormonas enlentecen los movimientos de los msculos que empujan los desechos a travs de los intestinos.  Pueden aparecer hemorroides o abultarse e hincharse las venas (venas varicosas).  Puede sentir dolor plvico debido al aumento de peso y a que las hormonas del embarazo relajan las articulaciones entre los huesos de la pelvis. El dolor de espalda puede ser consecuencia de la sobrecarga de los msculos que soportan la postura.  Tal vez haya cambios en el cabello que pueden incluir su engrosamiento, crecimiento rpido y cambios en la textura. Adems, a algunas mujeres se les cae el cabello durante o despus del embarazo, o tienen el cabello seco o fino. Lo ms probable es que el cabello se le normalice despus del nacimiento del beb.  Las mamas seguirn creciendo y le dolern. A veces, puede haber una secrecin amarilla de las mamas llamada calostro.  El ombligo puede salir hacia afuera.  Puede sentir que le falta el aire debido a que se expande el tero.  Puede notar que el feto  "baja" o lo siente ms bajo, en el abdomen.  Puede tener una prdida de secrecin mucosa con sangre. Esto suele ocurrir en el trmino de unos pocos das a una semana antes de que comience el trabajo de parto.  El cuello del tero se vuelve delgado y blando (se borra) cerca de la fecha de parto. QU DEBE ESPERAR EN LOS EXMENES PRENATALES Le harn exmenes prenatales cada 2semanas hasta la semana36. A partir de ese momento le harn exmenes semanales. Durante una visita prenatal de rutina:  La pesarn para asegurarse de que usted y el feto estn creciendo normalmente.  Le tomarn la presin arterial.  Le medirn el abdomen para controlar el desarrollo del beb.  Se escucharn los latidos cardacos fetales.  Se evaluarn los resultados de los estudios solicitados en visitas anteriores.  Le revisarn el cuello del tero cuando est prxima la fecha de parto para controlar si este se ha borrado. Alrededor de la semana36, el mdico le revisar el cuello del tero. Al mismo tiempo, realizar un anlisis de las secreciones del tejido vaginal. Este examen es para determinar si hay un tipo de bacteria, estreptococo Grupo B. El mdico le explicar esto con ms detalle. El mdico puede preguntarle lo siguiente:  Cmo le gustara que fuera el parto.  Cmo se siente.  Si siente los movimientos del beb.  Si ha tenido sntomas anormales, como prdida de lquido, sangrado, dolores de cabeza intensos o clicos abdominales.  Si est consumiendo algn producto que contenga tabaco, como cigarrillos, tabaco de mascar y   cigarrillos electrnicos.  Si tiene alguna pregunta. Otros exmenes o estudios de deteccin que pueden realizarse durante el tercer trimestre incluyen lo siguiente:  Anlisis de sangre para controlar los niveles de hierro (anemia).  Controles fetales para determinar su salud, nivel de actividad y crecimiento. Si tiene alguna enfermedad o hay problemas durante el embarazo, le harn  estudios.  Prueba del VIH (virus de inmunodeficiencia humana). Si corre un riesgo alto, pueden realizarle una prueba de deteccin del VIH durante el tercer trimestre del embarazo. FALSO TRABAJO DE PARTO Es posible que sienta contracciones leves e irregulares que finalmente desaparecen. Se llaman contracciones de Braxton Hicks o falso trabajo de parto. Las contracciones pueden durar horas, das o incluso semanas, antes de que el verdadero trabajo de parto se inicie. Si las contracciones ocurren a intervalos regulares, se intensifican o se hacen dolorosas, lo mejor es que la revise el mdico. SIGNOS DE TRABAJO DE PARTO  Clicos de tipo menstrual.  Contracciones cada 5minutos o menos.  Contracciones que comienzan en la parte superior del tero y se extienden hacia abajo, a la zona inferior del abdomen y la espalda.  Sensacin de mayor presin en la pelvis o dolor de espalda.  Una secrecin de mucosidad acuosa o con sangre que sale de la vagina. Si tiene alguno de estos signos antes de la semana37 del embarazo, llame a su mdico de inmediato. Debe concurrir al hospital para que la controlen inmediatamente. INSTRUCCIONES PARA EL CUIDADO EN EL HOGAR  Evite fumar, consumir hierbas, beber alcohol y tomar frmacos que no le hayan recetado. Estas sustancias qumicas afectan la formacin y el desarrollo del beb.  No consuma ningn producto que contenga tabaco, lo que incluye cigarrillos, tabaco de mascar y cigarrillos electrnicos. Si necesita ayuda para dejar de fumar, consulte al mdico. Puede recibir asesoramiento y otro tipo de recursos para dejar de fumar.  Siga las indicaciones del mdico en relacin con el uso de medicamentos. Durante el embarazo, hay medicamentos que son seguros de tomar y otros que no.  Haga ejercicio solamente como se lo haya indicado el mdico. Sentir clicos uterinos es un buen signo para detener la actividad fsica.  Contine comiendo alimentos sanos con  regularidad.  Use un sostn que le brinde buen soporte si le duelen las mamas.  No se d baos de inmersin en agua caliente, baos turcos ni saunas.  Use el cinturn de seguridad en todo momento mientras conduce.  No coma carne cruda ni queso sin cocinar; evite el contacto con las bandejas sanitarias de los gatos y la tierra que estos animales usan. Estos elementos contienen grmenes que pueden causar defectos congnitos en el beb.  Tome las vitaminas prenatales.  Tome entre 1500 y 2000mg de calcio diariamente comenzando en la semana20 del embarazo hasta el parto.  Si est estreida, pruebe un laxante suave (si el mdico lo autoriza). Consuma ms alimentos ricos en fibra, como vegetales y frutas frescos y cereales integrales. Beba gran cantidad de lquido para mantener la orina de tono claro o color amarillo plido.  Dese baos de asiento con agua tibia para aliviar el dolor o las molestias causadas por las hemorroides. Use una crema para las hemorroides si el mdico la autoriza.  Si tiene venas varicosas, use medias de descanso. Eleve los pies durante 15minutos, 3 o 4veces por da. Limite el consumo de sal en su dieta.  Evite levantar objetos pesados, use zapatos de tacones bajos y mantenga una buena postura.  Descanse con las piernas elevadas si tiene   calambres o dolor de cintura.  Visite a su dentista si no lo ha hecho durante el embarazo. Use un cepillo de dientes blando para higienizarse los dientes y psese el hilo dental con suavidad.  Puede seguir manteniendo relaciones sexuales, a menos que el mdico le indique lo contrario.  No haga viajes largos excepto que sea absolutamente necesario y solo con la autorizacin del mdico.  Tome clases prenatales para entender, practicar y hacer preguntas sobre el trabajo de parto y el parto.  Haga un ensayo de la partida al hospital.  Prepare el bolso que llevar al hospital.  Prepare la habitacin del beb.  Concurra a todas  las visitas prenatales segn las indicaciones de su mdico.  SOLICITE ATENCIN MDICA SI:  No est segura de que est en trabajo de parto o de que ha roto la bolsa de las aguas.  Tiene mareos.  Siente clicos leves, presin en la pelvis o dolor persistente en el abdomen.  Tiene nuseas, vmitos o diarrea persistentes.  Observa una secrecin vaginal con mal olor.  Siente dolor al orinar.  SOLICITE ATENCIN MDICA DE INMEDIATO SI:  Tiene fiebre.  Tiene una prdida de lquido por la vagina.  Tiene sangrado o pequeas prdidas vaginales.  Siente dolor intenso o clicos en el abdomen.  Sube o baja de peso rpidamente.  Tiene dificultad para respirar y siente dolor de pecho.  Sbitamente se le hinchan mucho el rostro, las manos, los tobillos, los pies o las piernas.  No ha sentido los movimientos del beb durante una hora.  Siente un dolor de cabeza intenso que no se alivia con medicamentos.  Su visin se modifica.  Esta informacin no tiene como fin reemplazar el consejo del mdico. Asegrese de hacerle al mdico cualquier pregunta que tenga. Document Released: 02/11/2005 Document Revised: 05/25/2014 Document Reviewed: 07/05/2012 Elsevier Interactive Patient Education  2017 Elsevier Inc.  

## 2016-07-27 ENCOUNTER — Encounter: Payer: Self-pay | Admitting: Family Medicine

## 2016-07-28 ENCOUNTER — Ambulatory Visit (INDEPENDENT_AMBULATORY_CARE_PROVIDER_SITE_OTHER): Payer: Self-pay | Admitting: Internal Medicine

## 2016-07-28 VITALS — BP 114/76 | HR 105 | Temp 98.6°F | Wt 179.0 lb

## 2016-07-28 DIAGNOSIS — N898 Other specified noninflammatory disorders of vagina: Secondary | ICD-10-CM

## 2016-07-28 DIAGNOSIS — O26893 Other specified pregnancy related conditions, third trimester: Secondary | ICD-10-CM

## 2016-07-28 LAB — POCT WET PREP (WET MOUNT)
Clue Cells Wet Prep Whiff POC: NEGATIVE
TRICHOMONAS WET PREP HPF POC: ABSENT

## 2016-07-28 MED ORDER — CLOTRIMAZOLE 1 % VA CREA: 1.0000 | TOPICAL_CREAM | Freq: Every day | VAGINAL | 0 refills | Status: AC

## 2016-07-28 NOTE — Progress Notes (Signed)
[name] is a 31 y.o. G4P3003 at 6311w0d here for routine follow up.  She reports yellow vaginal discharge with accompanying irritation beginning five days ago. Denies vaginal bleeding or any blood in discharge. Denies LOF. States that she was having contractions when seen one week ago, but says these have since stopped. Endorses fetal movement.  See flow sheet for details.  A/P: Pregnancy at 1811w0d. Doing well.   Pregnancy issues include breech, with Version on 07/06/16. Still cephalic per Leopold's today. Wet prep obtained due to vaginal discharge, which showed yeast. Prescribed clotrimazole 1% cream to be used nightly x7d.   Infant feeding choice: Breast Contraception choice: OCPs Infant circumcision desired: no  GBS and gc/chlamydia testing results were reviewed today.    NST + AFI scheduled for 3/16 at 2:45PM (earliest available appt).  Induction scheduled for 41+ weeks on 03/20 at 0630.  Labor and fetal movement precautions reviewed.  Tarri AbernethyAbigail J Sav[place], MD, MPH PGY-2 [facility] Pager [phone]

## 2016-07-28 NOTE — Patient Instructions (Addendum)
It was nice seeing you today Ms. Ortega-Perez!  [facility], [facility] at 2:45 PM for your ultrasound.   Your induction is scheduled for TUESDAY, MARCH 20 at 6:30 AM.   For your yeast infection, please use the clotrimazole cream every night for the next week (or until you go into labor). Apply one applicatorful vaginally each night.   [facility] if you notice any of the following: - You feel a gush of fluid (as if your water broke) - You begin having painful contractions approximately every 3-5 minutes apart that make it difficult to breathe or walk - You are not feeling your baby move - You have vaginal bleeding  The address [facility] is: [address]  [place], [place] [id number] [phone]   If you have any questions or concerns in the meantime, please feel free to call the clinic.   Be well,  Dr. Natale MilchLancaster

## 2016-07-29 ENCOUNTER — Telehealth (HOSPITAL_COMMUNITY): Payer: Self-pay | Admitting: *Deleted

## 2016-07-29 ENCOUNTER — Inpatient Hospital Stay (HOSPITAL_COMMUNITY): Payer: Medicaid Other | Admitting: Anesthesiology

## 2016-07-29 ENCOUNTER — Encounter (HOSPITAL_COMMUNITY): Payer: Self-pay

## 2016-07-29 ENCOUNTER — Encounter (HOSPITAL_COMMUNITY): Payer: Self-pay | Admitting: Anesthesiology

## 2016-07-29 ENCOUNTER — Inpatient Hospital Stay (HOSPITAL_COMMUNITY)
Admission: AD | Admit: 2016-07-29 | Discharge: 2016-07-31 | DRG: 767 | Disposition: A | Discharge status: Home / Self Care | Payer: Medicaid Other | Source: Ambulatory Visit | Attending: Obstetrics & Gynecology | Admitting: Obstetrics & Gynecology

## 2016-07-29 DIAGNOSIS — O26893 Other specified pregnancy related conditions, third trimester: Principal | ICD-10-CM | POA: Diagnosis present

## 2016-07-29 DIAGNOSIS — Z3A40 40 weeks gestation of pregnancy: Secondary | ICD-10-CM | POA: Diagnosis not present

## 2016-07-29 DIAGNOSIS — O864 Pyrexia of unknown origin following delivery: Secondary | ICD-10-CM | POA: Diagnosis not present

## 2016-07-29 DIAGNOSIS — Z6790 Unspecified blood type, Rh positive: Secondary | ICD-10-CM

## 2016-07-29 DIAGNOSIS — Z3493 Encounter for supervision of normal pregnancy, unspecified, third trimester: Secondary | ICD-10-CM | POA: Diagnosis present

## 2016-07-29 LAB — CBC
HCT: 36.5 % (ref 36.0–46.0)
HEMOGLOBIN: 12 g/dL (ref 12.0–15.0)
MCH: 26.7 pg (ref 26.0–34.0)
MCHC: 32.9 g/dL (ref 30.0–36.0)
MCV: 81.1 fL (ref 78.0–100.0)
Platelets: 173 10*3/uL (ref 150–400)
RBC: 4.5 MIL/uL (ref 3.87–5.11)
RDW: 14.5 % (ref 11.5–15.5)
WBC: 8.4 10*3/uL (ref 4.0–10.5)

## 2016-07-29 MED ORDER — PHENYLEPHRINE 40 MCG/ML (10ML) SYRINGE FOR IV PUSH (FOR BLOOD PRESSURE SUPPORT)
80.0000 ug | PREFILLED_SYRINGE | INTRAVENOUS | Status: DC | PRN
  Filled 2016-07-29: qty 5
  Filled 2016-07-29: qty 10

## 2016-07-29 MED ORDER — OXYTOCIN 40 UNITS IN LACTATED RINGERS INFUSION - SIMPLE MED
2.5000 [IU]/h | INTRAVENOUS | Status: DC
  Administered 2016-07-30: 39.96 [IU]/h via INTRAVENOUS

## 2016-07-29 MED ORDER — EPHEDRINE 5 MG/ML INJ
10.0000 mg | INTRAVENOUS | Status: DC | PRN
  Filled 2016-07-29: qty 2

## 2016-07-29 MED ORDER — FENTANYL CITRATE (PF) 100 MCG/2ML IJ SOLN: 100.0000 ug | INTRAMUSCULAR | Status: DC | PRN

## 2016-07-29 MED ORDER — OXYCODONE-ACETAMINOPHEN 5-325 MG PO TABS: 2.0000 | ORAL_TABLET | ORAL | Status: DC | PRN

## 2016-07-29 MED ORDER — FENTANYL 2.5 MCG/ML BUPIVACAINE 1/10 % EPIDURAL INFUSION (WH - ANES)
14.0000 mL/h | INTRAMUSCULAR | Status: DC | PRN
  Administered 2016-07-29: 14 mL/h via EPIDURAL
  Filled 2016-07-29: qty 100

## 2016-07-29 MED ORDER — ONDANSETRON HCL 4 MG/2ML IJ SOLN: 4.0000 mg | Freq: Four times a day (QID) | INTRAMUSCULAR | Status: DC | PRN

## 2016-07-29 MED ORDER — OXYCODONE-ACETAMINOPHEN 5-325 MG PO TABS: 1.0000 | ORAL_TABLET | ORAL | Status: DC | PRN

## 2016-07-29 MED ORDER — PHENYLEPHRINE 40 MCG/ML (10ML) SYRINGE FOR IV PUSH (FOR BLOOD PRESSURE SUPPORT)
80.0000 ug | PREFILLED_SYRINGE | INTRAVENOUS | Status: DC | PRN
  Filled 2016-07-29: qty 5

## 2016-07-29 MED ORDER — ACETAMINOPHEN 325 MG PO TABS: 650.0000 mg | ORAL_TABLET | ORAL | Status: DC | PRN

## 2016-07-29 MED ORDER — OXYTOCIN BOLUS FROM INFUSION
500.0000 mL | Freq: Once | INTRAVENOUS | Status: AC
  Administered 2016-07-30: 500 mL via INTRAVENOUS

## 2016-07-29 MED ORDER — LIDOCAINE HCL (PF) 1 % IJ SOLN
30.0000 mL | INTRAMUSCULAR | Status: DC | PRN
  Filled 2016-07-29: qty 30

## 2016-07-29 MED ORDER — LIDOCAINE HCL (PF) 1 % IJ SOLN
INTRAMUSCULAR | Status: AC
  Filled 2016-07-29: qty 30

## 2016-07-29 MED ORDER — LACTATED RINGERS IV SOLN
INTRAVENOUS | Status: DC
  Administered 2016-07-29 – 2016-07-30 (×2): via INTRAVENOUS

## 2016-07-29 MED ORDER — OXYTOCIN 40 UNITS IN LACTATED RINGERS INFUSION - SIMPLE MED
INTRAVENOUS | Status: AC
  Administered 2016-07-30: 500 mL via INTRAVENOUS
  Filled 2016-07-29: qty 1000

## 2016-07-29 MED ORDER — DIPHENHYDRAMINE HCL 50 MG/ML IJ SOLN: 12.5000 mg | INTRAMUSCULAR | Status: DC | PRN

## 2016-07-29 MED ORDER — LACTATED RINGERS IV SOLN
500.0000 mL | Freq: Once | INTRAVENOUS | Status: AC
  Administered 2016-07-29: 500 mL via INTRAVENOUS

## 2016-07-29 MED ORDER — LACTATED RINGERS IV SOLN: 500.0000 mL | INTRAVENOUS | Status: DC | PRN

## 2016-07-29 MED ORDER — SOD CITRATE-CITRIC ACID 500-334 MG/5ML PO SOLN: 30.0000 mL | ORAL | Status: DC | PRN

## 2016-07-29 MED ORDER — LIDOCAINE HCL (PF) 1 % IJ SOLN
INTRAMUSCULAR | Status: DC | PRN
  Administered 2016-07-29: 6 mL
  Administered 2016-07-29: 6 mL via EPIDURAL

## 2016-07-29 MED ORDER — OXYTOCIN 10 UNIT/ML IJ SOLN
INTRAMUSCULAR | Status: AC
  Filled 2016-07-29: qty 1

## 2016-07-29 NOTE — Anesthesia Procedure Notes (Signed)
Epidural Patient location during procedure: OB Start time: 07/29/2016 11:26 PM End time: 07/29/2016 11:29 PM  Staffing Anesthesiologist: Leilani AbleHATCHETT, Evin Loiseau Performed: anesthesiologist   Preanesthetic Checklist Completed: patient identified, surgical consent, pre-op evaluation, timeout performed, IV checked, risks and benefits discussed and monitors and equipment checked  Epidural Patient position: sitting Prep: site prepped and draped and DuraPrep Patient monitoring: continuous pulse ox and blood pressure Approach: midline Location: L3-L4 Injection technique: LOR air  Needle:  Needle type: Tuohy  Needle gauge: 17 G Needle length: 9 cm and 9 Needle insertion depth: 6 cm Catheter type: closed end flexible Catheter size: 19 Gauge Catheter at skin depth: 12 cm Test dose: negative and Other  Assessment Sensory level: T9 Events: blood not aspirated, injection not painful, no injection resistance, negative IV test and no paresthesia  Additional Notes Reason for block:procedure for pain

## 2016-07-29 NOTE — Anesthesia Preprocedure Evaluation (Signed)
Anesthesia Evaluation  Patient identified by MRN, date of birth, ID band Patient awake    Reviewed: Allergy & Precautions, H&P , NPO status , Patient's Chart, lab work & pertinent test results  Airway Mallampati: I  TM Distance: >3 FB Neck ROM: full    Dental no notable dental hx.    Pulmonary neg pulmonary ROS,    Pulmonary exam normal        Cardiovascular negative cardio ROS Normal cardiovascular exam     Neuro/Psych negative neurological ROS  negative psych ROS   GI/Hepatic negative GI ROS, Neg liver ROS,   Endo/Other  negative endocrine ROS  Renal/GU negative Renal ROS     Musculoskeletal   Abdominal (+) + obese,   Peds  Hematology negative hematology ROS (+)   Anesthesia Other Findings   Reproductive/Obstetrics (+) Pregnancy                             Anesthesia Physical Anesthesia Plan  ASA: II  Anesthesia Plan: Epidural   Post-op Pain Management:    Induction:   Airway Management Planned:   Additional Equipment:   Intra-op Plan:   Post-operative Plan:   Informed Consent: I have reviewed the patients History and Physical, chart, labs and discussed the procedure including the risks, benefits and alternatives for the proposed anesthesia with the patient or authorized representative who has indicated his/her understanding and acceptance.     Plan Discussed with:   Anesthesia Plan Comments:         Anesthesia Quick Evaluation  

## 2016-07-29 NOTE — H&P (Signed)
LABOR AND DELIVERY ADMISSION HISTORY AND PHYSICAL NOTE  Victoria Liu is a 31 y.o. female [phone] with IUP at [redacted]w[redacted]d by LMP presenting for SOL. Onset of contractions around 08:00 this morning. Endorses good fetal movement, with vaginal leakage and some bleeding prompting presentation to [facility]. Denies h/a, change in vision, LE edema.  Prenatal History/Complications: UTI during pregnancy (E. Coli, treated with TOC 05/14/2016) Spanish speaking only Breech position with successful external cephalic version (07/06/2016)  Past Medical History: Past Medical History:  Diagnosis Date  . Medical history non-contributory     Past Surgical History: Past Surgical History:  Procedure Laterality Date  . NO PAST SURGERIES      Obstetrical History: OB History    Gravida Para Term Preterm AB Living   4 3 3     3    SAB TAB Ectopic Multiple Live Births           3      Social History: Social History   Social History  . Marital status: Married    Spouse name: Renette Butters  . Number of children: N/A  . Years of education: N/A   Occupational History  . stay at home mother    Social History Main Topics  . Smoking status: Never Smoker  . Smokeless tobacco: Never Used  . Alcohol use No  . Drug use: No  . Sexual activity: Yes    Partners: Male     Comment: husband   Other Topics Concern  . Not on file   Social History Narrative  . No narrative on file    Family History: Family History  Problem Relation Age of Onset  . Alcohol abuse Neg Hx   . Arthritis Neg Hx   . Asthma Neg Hx   . Birth defects Neg Hx   . Cancer Neg Hx   . COPD Neg Hx   . Depression Neg Hx   . Diabetes Neg Hx   . Drug abuse Neg Hx   . Early death Neg Hx   . Hearing loss Neg Hx   . Heart disease Neg Hx   . Hyperlipidemia Neg Hx   . Hypertension Neg Hx   . Mental illness Neg Hx   . Learning disabilities Neg Hx   . Kidney disease Neg Hx   . Mental retardation Neg Hx   . Miscarriages /  Stillbirths Neg Hx   . Stroke Neg Hx   . Vision loss Neg Hx     Allergies: No Known Allergies  Prescriptions Prior to Admission  Medication Sig Dispense Refill Last Dose  . cetirizine (ZYRTEC) 10 MG tablet Take 1 tablet (10 mg total) by mouth daily. 30 tablet 5   . clotrimazole (GYNE-LOTRIMIN) 1 % vaginal cream Place 1 Applicatorful vaginally at bedtime. Please use nightly for 7 days. 45 g 0   . Prenatal Multivit-Min-Fe-FA (PRENATAL VITAMINS) 0.8 MG tablet Take 1 tablet by mouth daily. 30 tablet 4      Review of Systems   All systems reviewed and negative except as stated in HPI  Blood pressure 118/69, pulse 83, resp. rate 20, last menstrual period 10/22/2015, SpO2 100 %, unknown if currently breastfeeding. General appearance: alert, cooperative and mild distress Lungs: clear to auscultation bilaterally Heart: regular rate and rhythm Abdomen: soft, non-tender; bowel sounds normal Extremities: No calf swelling or tenderness Presentation: cephalic Fetal monitoring: FHR 145 bpm, moderate variability, accelerations present, no declerations Uterine activity: regular q 2 mins Dilation: 6.5 Effacement (%): 90 Station: -  2 Exam by:: Kayren EavesAshley Garvey RN    Prenatal labs: ABO, Rh: B/NEG/-- (09/20 1023) Antibody: NEG (12/28 1547) Rubella: Immune RPR: NON REAC (12/28 1550)  HBsAg: NEGATIVE (09/20 1023)  HIV: NONREACTIVE (12/28 1550)  GBS: NOT DETECTED (02/15 1347)  1 hr Glucola: Normal (99) Genetic screening:  Normal Anatomy US: Normal  Prenatal Transfer Tool  Maternal Diabetes: No Genetic Screening: Normal Maternal Ultrasounds/Referrals: Normal Fetal Ultrasounds or other Referrals:  None Maternal Substance Abuse:  No Significant Maternal Medications:  None Significant Maternal Lab Results: Lab values include: Rh negative  No results found for this or any previous visit (from the past 24 hour(s)).  Patient Active Problem List   Diagnosis Date Noted  . Breech presentation  07/06/2016  . Blood type, Rh negative 02/12/2016  . Supervision of normal pregnancy 07/15/2013    Assessment: Victoria Liu is a 31 y.o. G4P3003 at 1859w1d here for SOL.  #Labor:SOL #Pain: Epidural if requested #FWB: Category I #ID:  GBS negative #MOF: Breast #MOC:OCP #Circ:  Declined  Wendee Beaversavid J McMullen, DO, PGY-1 07/29/2016, 10:41 PM  CNM attestation:  I have seen and examined this patient; I agree with above documentation in the resident's note.   Victoria Liu is a 31 y.o. [phone] here for SOL  PE: BP 112/67   Pulse 72   Temp 98.3 F (36.8 C) (Oral)   Resp 18   Ht 5\' 4"  (1.626 m)   Wt 81.2 kg (179 lb)   LMP 10/22/2015 (Exact Date)   SpO2 100%   BMI 30.73 kg/m   Resp: normal effort, no distress Abd: gravid  ROS, labs, PMH reviewed  [facility]: [facility] Anticipate SVD  Cam HaiSHAW, Shamyia Grandpre CNM 07/30/2016, 2:53 AM

## 2016-07-30 ENCOUNTER — Encounter (HOSPITAL_COMMUNITY): Payer: Self-pay | Admitting: *Deleted

## 2016-07-30 DIAGNOSIS — Z3A40 40 weeks gestation of pregnancy: Secondary | ICD-10-CM

## 2016-07-30 LAB — TYPE AND SCREEN
ABO/RH(D): B NEG
Antibody Screen: NEGATIVE

## 2016-07-30 LAB — RPR: RPR Ser Ql: NONREACTIVE

## 2016-07-30 MED ORDER — MISOPROSTOL 200 MCG PO TABS
600.0000 ug | ORAL_TABLET | Freq: Once | ORAL | Status: AC
  Administered 2016-07-30: 600 ug via ORAL

## 2016-07-30 MED ORDER — METHYLERGONOVINE MALEATE 0.2 MG/ML IJ SOLN
0.2000 mg | Freq: Once | INTRAMUSCULAR | Status: AC
  Administered 2016-07-30: 0.2 mg via INTRAMUSCULAR

## 2016-07-30 MED ORDER — TETANUS-DIPHTH-ACELL PERTUSSIS 5-2.5-18.5 LF-MCG/0.5 IM SUSP: 0.5000 mL | Freq: Once | INTRAMUSCULAR | Status: DC

## 2016-07-30 MED ORDER — PRENATAL MULTIVITAMIN CH
1.0000 | ORAL_TABLET | Freq: Every day | ORAL | Status: DC
  Administered 2016-07-30 – 2016-07-31 (×2): 1 via ORAL
  Filled 2016-07-30 (×2): qty 1

## 2016-07-30 MED ORDER — OXYCODONE HCL 5 MG PO TABS
5.0000 mg | ORAL_TABLET | ORAL | Status: DC | PRN
  Administered 2016-07-30: 5 mg via ORAL
  Filled 2016-07-30: qty 1

## 2016-07-30 MED ORDER — BENZOCAINE-MENTHOL 20-0.5 % EX AERO
1.0000 "application " | INHALATION_SPRAY | CUTANEOUS | Status: DC | PRN
  Administered 2016-07-30: 1 via TOPICAL
  Filled 2016-07-30: qty 56

## 2016-07-30 MED ORDER — ACETAMINOPHEN 325 MG PO TABS
650.0000 mg | ORAL_TABLET | ORAL | Status: DC | PRN
  Administered 2016-07-30: 650 mg via ORAL
  Filled 2016-07-30: qty 2

## 2016-07-30 MED ORDER — IBUPROFEN 600 MG PO TABS
600.0000 mg | ORAL_TABLET | Freq: Four times a day (QID) | ORAL | Status: DC
  Administered 2016-07-30 – 2016-07-31 (×5): 600 mg via ORAL
  Filled 2016-07-30 (×5): qty 1

## 2016-07-30 MED ORDER — ZOLPIDEM TARTRATE 5 MG PO TABS: 5.0000 mg | ORAL_TABLET | Freq: Every evening | ORAL | Status: DC | PRN

## 2016-07-30 MED ORDER — DIPHENHYDRAMINE HCL 25 MG PO CAPS: 25.0000 mg | ORAL_CAPSULE | Freq: Four times a day (QID) | ORAL | Status: DC | PRN

## 2016-07-30 MED ORDER — PIPERACILLIN-TAZOBACTAM 3.375 G IVPB
3.3750 g | Freq: Three times a day (TID) | INTRAVENOUS | Status: DC
  Administered 2016-07-30 – 2016-07-31 (×4): 3.375 g via INTRAVENOUS
  Filled 2016-07-30 (×4): qty 50

## 2016-07-30 MED ORDER — WITCH HAZEL-GLYCERIN EX PADS
1.0000 "application " | MEDICATED_PAD | CUTANEOUS | Status: DC | PRN
  Administered 2016-07-30: 1 via TOPICAL

## 2016-07-30 MED ORDER — ONDANSETRON HCL 4 MG/2ML IJ SOLN: 4.0000 mg | INTRAMUSCULAR | Status: DC | PRN

## 2016-07-30 MED ORDER — SIMETHICONE 80 MG PO CHEW: 80.0000 mg | CHEWABLE_TABLET | ORAL | Status: DC | PRN

## 2016-07-30 MED ORDER — METHYLERGONOVINE MALEATE 0.2 MG/ML IJ SOLN
INTRAMUSCULAR | Status: AC
  Filled 2016-07-30: qty 1

## 2016-07-30 MED ORDER — DIBUCAINE 1 % RE OINT: 1.0000 "application " | TOPICAL_OINTMENT | RECTAL | Status: DC | PRN

## 2016-07-30 MED ORDER — ONDANSETRON HCL 4 MG PO TABS: 4.0000 mg | ORAL_TABLET | ORAL | Status: DC | PRN

## 2016-07-30 MED ORDER — SODIUM CHLORIDE 0.9% FLUSH
3.0000 mL | INTRAVENOUS | Status: DC | PRN
  Administered 2016-07-30: 3 mL via INTRAVENOUS
  Filled 2016-07-30: qty 10

## 2016-07-30 MED ORDER — COCONUT OIL OIL: 1.0000 "application " | TOPICAL_OIL | Status: DC | PRN

## 2016-07-30 MED ORDER — SENNOSIDES-DOCUSATE SODIUM 8.6-50 MG PO TABS
2.0000 | ORAL_TABLET | ORAL | Status: DC
  Administered 2016-07-31: 2 via ORAL
  Filled 2016-07-30: qty 2

## 2016-07-30 MED ORDER — MISOPROSTOL 200 MCG PO TABS
ORAL_TABLET | ORAL | Status: AC
  Filled 2016-07-30: qty 3

## 2016-07-30 NOTE — Anesthesia Postprocedure Evaluation (Signed)
Anesthesia Post Note  Patient: Victoria GrosVeronica Liu  Procedure(s) Performed: * No procedures listed *  Patient location during evaluation: Mother Baby Anesthesia Type: Epidural Level of consciousness: awake Pain management: pain level controlled Vital Signs Assessment: post-procedure vital signs reviewed and stable Respiratory status: spontaneous breathing Cardiovascular status: stable Postop Assessment: no headache, no backache, epidural receding and patient able to bend at knees Anesthetic complications: no        Last Vitals:  Vitals:   07/30/16 0915 07/30/16 1258  BP:  (!) 97/52  Pulse:  72  Resp: 20 18  Temp: 37.6 C 37 C    Last Pain:  Vitals:   07/30/16 1300  TempSrc:   PainSc: 4    Pain Goal: Patients Stated Pain Goal: 4 (07/30/16 0545)               Edison PaceWILKERSON,Valisha Heslin

## 2016-07-30 NOTE — Progress Notes (Signed)
Dr Despina HiddenEure in to confirm manual removal of all of placenta.  Placenta to go to path,.  Antibiotics to be ordered for manual removal.

## 2016-07-30 NOTE — Progress Notes (Signed)
Awaiting placenta 

## 2016-07-30 NOTE — Lactation Note (Signed)
This note was copied from a baby's chart. Lactation Consultation Note  [name] interpreter present for Spanish. P4,  Breastfed other children for 3-6 months. Mother stated she did not know how to hand express. Reviewed hand expression bilaterally and demonstrated how to spoon feed. Encouraged her to hand express before each feeding. Reviewed supply and demand. Suggest she offer breast before giving formula. Mom encouraged to feed baby 8-12 times/24 hours and with feeding cues.  Mom made aware of O/P services, breastfeeding support groups, community resources, and our phone # for post-discharge questions.        Patient Name: Victoria Liu ZOXWR'UToday's Date: 07/30/2016 Reason for consult: Follow-up assessment   Maternal Data Has patient been taught Hand Expression?: Yes Does the patient have breastfeeding experience prior to this delivery?: Yes  Feeding Feeding Type: Formula Nipple Type: Slow - flow Length of feed: 30 min  LATCH Score/Interventions                      Lactation Tools Discussed/Used     Consult Status Consult Status: Follow-up Date: 07/31/16 Follow-up type: In-patient    Dahlia ByesBerkelhammer, Ruth [facility] 07/30/2016, 4:05 PM

## 2016-07-30 NOTE — Progress Notes (Signed)
Victoria GrosVeronica Liu is a 31 y.o. G4P3003 at 4855w2d  Subjective: Patient doing well. No complaints.  Objective: BP 120/80   Pulse 71   Temp 98.1 F (36.7 C) (Oral)   Resp 18   Ht 5\' 4"  (1.626 m)   Wt 179 lb (81.2 kg)   LMP 10/22/2015 (Exact Date)   SpO2 100%   BMI 30.73 kg/m  No intake/output data recorded. No intake/output data recorded.  FHT:  FHR: 140 bpm, variability: moderate,  accelerations:  Present,  decelerations:  Absent UC:   regular, every 2 minutes SVE:   Dilation: 9 Effacement (%): 100 Station: 0 Exam by:: e. poore ,rnc   Labs: Lab Results  Component Value Date   WBC 8.4 07/29/2016   HGB 12.0 07/29/2016   HCT 36.5 07/29/2016   MCV 81.1 07/29/2016   PLT 173 07/29/2016    Assessment / Plan: Spontaneous labor, progressing normally, AROM clear 03:15.  Labor: Progressing normally Preeclampsia:  None Fetal Wellbeing:  Category I Pain Control:  Epidural I/D:  GBS negative Anticipated MOD:  NSVD  Wendee Beaversavid J McMullen, DO, PGY-1 07/30/2016, 3:23 AM

## 2016-07-31 ENCOUNTER — Ambulatory Visit (HOSPITAL_COMMUNITY): Admission: RE | Admit: 2016-07-31 | Payer: Self-pay | Source: Ambulatory Visit

## 2016-07-31 ENCOUNTER — Ambulatory Visit (HOSPITAL_COMMUNITY): Payer: Self-pay

## 2016-07-31 LAB — CBC
HCT: 30.9 % — ABNORMAL LOW (ref 36.0–46.0)
Hemoglobin: 10.3 g/dL — ABNORMAL LOW (ref 12.0–15.0)
MCH: 27.2 pg (ref 26.0–34.0)
MCHC: 33.3 g/dL (ref 30.0–36.0)
MCV: 81.5 fL (ref 78.0–100.0)
PLATELETS: 156 10*3/uL (ref 150–400)
RBC: 3.79 MIL/uL — AB (ref 3.87–5.11)
RDW: 14.9 % (ref 11.5–15.5)
WBC: 11 10*3/uL — AB (ref 4.0–10.5)

## 2016-07-31 MED ORDER — IBUPROFEN 600 MG PO TABS: 600.0000 mg | ORAL_TABLET | Freq: Four times a day (QID) | ORAL | 0 refills | Status: AC

## 2016-07-31 MED ORDER — RHO D IMMUNE GLOBULIN 1500 UNIT/2ML IJ SOSY
300.0000 ug | PREFILLED_SYRINGE | Freq: Once | INTRAMUSCULAR | Status: AC
  Administered 2016-07-31: 300 ug via INTRAVENOUS
  Filled 2016-07-31: qty 2

## 2016-07-31 MED ORDER — NORETHINDRONE 0.35 MG PO TABS: 1.0000 | ORAL_TABLET | Freq: Every day | ORAL | 11 refills | Status: AC

## 2016-07-31 NOTE — Progress Notes (Signed)
Interpreter in to do teaching with mom for DC. All questions answered.

## 2016-07-31 NOTE — Lactation Note (Signed)
This note was copied from a baby's chart. Lactation Consultation Note  Patient Name: Boy [name] [id number]'s Date: 07/31/2016 Reason for consult: Follow-up assessment   With this experienced breast feeding mom and term, LGA baby. Mom denied needing interpreter. . Mom has been breast and formula feeding. I showed mom how to use pillows to support the baby, and for her to support her back, and to latch baby deeply with cross cradle hold, and then switch to cradle. I also advised mom to not make a "breathing " space by baby's nose. The baby latched easily, deeply, with strong suckles and swallows, and good breast movement. I reviewed hand expression with mom. I advised her that formula should not be necessary at this time, that her milk will be coming in. Mom very receptive to my teaching, and knows to call for questions/concerns.    Maternal Data    Feeding Feeding Type: Breast Fed Length of feed: 15 min  LATCH Score/Interventions Latch: Grasps breast easily, tongue down, lips flanged, rhythmical sucking.  Audible Swallowing: A few with stimulation Intervention(s): Skin to skin;Hand expression  Type of Nipple: Everted at rest and after stimulation  Comfort (Breast/Nipple): Soft / non-tender     Hold (Positioning): Assistance needed to correctly position infant at breast and maintain latch. Intervention(s): Breastfeeding basics reviewed;Support Pillows;Position options;Skin to skin  LATCH Score: 8  Lactation Tools Discussed/Used     Consult Status Consult Status: Complete Follow-up type: Call as needed    Alfred LevinsLee, Kiaja Shorty Anne 07/31/2016, 12:54 PM

## 2016-07-31 NOTE — Progress Notes (Signed)
Eta Interpreter in to go over plan of care. Patient aware that she can call out for pain meds if needed. She had no questions for me at that time. Patient states she doesn't have any complaints.

## 2016-07-31 NOTE — Discharge Summary (Signed)
Obstetric Discharge Summary Reason for Admission: [redacted] weeks EGA, SOL Prenatal Procedures: ultrasound Intrapartum Procedures: spontaneous vaginal delivery Postpartum Procedures: She had a manual removal of her placenta and spiked a fever to 103 immediately after delivery. She was treated with zosyn and remained AF for 24 hours prior to discharge. Complications-Operative and Postpartum: none Hemoglobin  Date Value Ref Range Status  07/31/2016 10.3 (L) 12.0 - 15.0 g/dL Final   HCT  Date Value Ref Range Status  07/31/2016 30.9 (L) 36.0 - 46.0 % Final    Physical Exam:  General: alert Lochia: appropriate Uterine Fundus: firm and NT at U-1 DVT Evaluation: No evidence of DVT seen on physical exam.  Discharge Diagnoses: Term Pregnancy-delivered  Discharge Information: Date: 07/31/2016 Activity: pelvic rest Diet: routine Medications: PNV and Ibuprofen, and Micronor Condition: stable Instructions: refer to practice specific booklet Discharge to: home Follow-up Information    [facility], [place]. Schedule an appointment as soon as possible for a visit in 6 week(s).   Specialty:  Family Medicine Contact information: [address] [place] [id number] [phone]           Newborn Data: Live born female  Birth Weight: 9 lb 1 oz (4110 g) APGAR: 8, 9  Home with mother.  Allie BossierMyra C Ryleigh Esqueda 07/31/2016, 6:31 AM

## 2016-07-31 NOTE — Discharge Instructions (Signed)
Instrucciones para la mamá sobre los cuidados en el hogar °(Home Care Instructions for Mom) °ACTIVIDAD °· Reanude sus actividades regulares de forma gradual. °· Descanse. Tome siestas cuando el bebé duerme. °· No levante objetos que pesen más de 10 libras (4,5 kg) hasta que el médico se lo autorice. °· Evite las actividades que demandan mucho esfuerzo y energía (que son extenuantes) hasta que el médico se lo autorice. Caminar a un ritmo tranquilo a moderado siempre es más seguro. °· Si tuvo un parto por cesárea: °? No pase la aspiradora, suba escaleras o conduzca un vehículo durante 4 o 6 semanas. °? Pídale a alguien que le brinde ayuda con las tareas domésticas hasta que pueda realizarlas por su cuenta. °? Haga ejercicios como se lo haya indicado el médico, si corresponde. ° °HEMORRAGIA VAGINAL °Probablemente continúe sangrando durante 4 o 6 semanas después del parto. Generalmente, la cantidad de sangre disminuye y el color se hace más claro con el transcurso del tiempo. Sin embargo, si usted está demasiado activa, el color de la sangre puede ser rojo brillante. Si necesita cambiarse la compresa higiénica en menos de una hora o tiene coágulos grandes: °· Permanezca acostada. °· Eleve los pies. °· Coloque compresas frías en la zona inferior del abdomen. °· Haga reposo. °· Comuníquese con su médico. °Si está amamantando, podría volver a tener su período entre las 8 semanas después del parto y el momento en que deje de amamantar. Si no está amamantando, volverá a tener su período 6 u 8 semanas después del parto. °CUIDADOS PERINEALES °La zona perineal o perineo, es la parte del cuerpo que se encuentra entre los muslos. Después del parto, esta zona necesita un cuidado especial. Siga las siguientes indicaciones como se lo haya indicado su médico. °· Tome baños de inmersión durante 15 o 20 minutos. °· Utilice apósitos o aerosoles analgésicos y cremas como se lo hayan indicado. °· No utilice tampones ni se haga duchas  vaginales hasta que el sangrado vaginal se haya detenido. °· Cada vez que vaya al baño: °? Use una botella perineal. °? Cámbiese el apósito. °? Use papel tisú en lugar de papel higiénico hasta que se cure la sutura. °· Haga ejercicios de Kegel todos los días. Los ejercicios Kegel ayudan a mantener los músculos que sostienen la vagina, la vejiga y los intestinos. Estos ejercicios se pueden realizar mientras está parada, sentada o acostada. Para hacer los ejercicios de Kegel: °? Tense los músculos del estómago y los que rodean el canal de parto. °? Mantenga esta posición durante unos segundos. °? Relájese. °? Repita hasta hacerlos 5 veces seguidas. °· Para evitar las hemorroides o que estas empeoren: °? Beba suficiente líquido para mantener la orina clara o de color amarillo pálido. °? Evite hacer fuerza al defecar. °? Tome los medicamentos y laxantes de venta libre como se lo haya indicado el médico. °CUIDADO DE LAS MAMAS °· Use un buen sostén. °· Evite tomar analgésicos de venta libre para las molestias de los pechos. °· Aplique hielo en los pechos para aliviar las molestias tanto como sea necesario: °? Ponga el hielo en una bolsa plástica. °? Coloque una toalla entre la piel y la bolsa de hielo. °? Aplique el hielo durante 20, o como se lo haya indicado el médico. ° °NUTRICIÓN °· Mantenga una dieta bien balanceada. °· No intente perder de peso rápidamente reduciendo el consumo de calorías. °· Tome sus vitaminas prenatales hasta el control de postparto o hasta que su médico se lo indique. ° °DEPRESIÓN POSTPARTO °  Puede sentir deseos de llorar sin motivo aparente y verse incapaz de enfrentarse a todos los cambios que implica tener un bebé. Este estado de ánimo se llama depresión postparto. La depresión postparto ocurre porque sus niveles hormonales sufren cambios después del parto. Si usted tiene depresión postparto, busque contención por parte de su pareja, sus amigos y su familia. Si la depresión no desaparece por  sí sola después de algunas semanas, concurra a su médico. °AUTOEXAMEN DE MAMAS °Realícese autoexámenes en el mismo momento cada mes. Si está amamantando, el mejor momento de controlar sus mamas es después de alimentar al bebé, cuando los pechos no están tan llenos. Si está amamantando y su período ya comenzó, controle sus mamas el día 5, 6 o 7 de su período. °Informe a su médico de cualquier protuberancia, bulto o secreción. Si está amamantando, las mamas normalmente tienen bultos. Esto es transitorio y no es un riesgo para la salud. °INTIMIDAD Y SEXUALIDAD °Debe evitar las relaciones sexuales durante al menos 3 o 4 semanas después del parto o hasta que el flujo de color rojo amarronado haya desaparecido completamente. Si no desea quedar embarazada nuevamente, use algún método anticonceptivo. Después del parto, puede quedar embarazada incluso si no ha tenido todavía el período. °SOLICITE ATENCIÓN MÉDICA SI: °· Se siente incapaz de controlar los cambios que implica tener un hijo y esos sentimientos no desaparecen después de algunas semanas. °· Detecta una protuberancia, bulto o secreción en sus mamas. ° °SOLICITE ATENCIÓN MÉDICA DE INMEDIATO SI: °· Debe cambiarse la compresa higiénica en 1 hora o menos. °· Tiene los siguientes síntomas: °? Dolor intenso o calambres en la parte inferior del abdomen. °? Una secreción vaginal con mal olor. °? Fiebre que no se alivia con los medicamentos. °? Una zona de la mama se pone roja y le causa dolor, y además usted tiene fiebre. °? Una pantorrilla enrojecida y con dolor. °? Repentino e intenso dolor en el pecho. °? Falta de aire. °? Micción dolorosa o con sangre. °? Problemas visuales. °· Vómitos durante 12 horas o más. °· Dolor de cabeza intenso. °· Tiene pensamientos serios acerca de lastimarse a usted misma o dañar al niño o a otra persona. ° °Esta información no tiene como fin reemplazar el consejo del médico. Asegúrese de hacerle al médico cualquier pregunta que  tenga. °Document Released: 05/04/2005 Document Revised: 08/26/2015 Document Reviewed: 11/05/2014 °Elsevier Interactive Patient Education © 2017 [employer] ° °

## 2016-08-01 LAB — RH IG WORKUP (INCLUDES ABO/RH)
ABO/RH(D): B NEG
Fetal Screen: NEGATIVE
Gestational Age(Wks): 40.2
Unit division: 0

## 2016-08-04 ENCOUNTER — Inpatient Hospital Stay (HOSPITAL_COMMUNITY): Admission: RE | Admit: 2016-08-04 | Payer: Self-pay | Source: Ambulatory Visit

## 2016-10-29 ENCOUNTER — Ambulatory Visit: Payer: Self-pay | Admitting: Family Medicine

## 2016-10-29 NOTE — Progress Notes (Deleted)
   Subjective:    Patient ID: Victoria Liu , female   DOB: 1985-12-02 , [age] y.o..   MRN: [id number]  HPI  Victoria Liu is here for No chief complaint on file.   1. Post partum visit:   Review of Systems: Per HPI. All other systems reviewed and are negative.  There are no preventive care reminders to display for this patient.  Past Medical History: Patient Active Problem List   Diagnosis Date Noted  . Labor and delivery, indication for care 07/29/2016  . Breech presentation 07/06/2016  . Blood type, Rh negative 02/12/2016  . Supervision of normal pregnancy 07/15/2013    Medications: reviewed and updated Current Outpatient Prescriptions  Medication Sig Dispense Refill  . clotrimazole (GYNE-LOTRIMIN) 1 % vaginal cream Place 1 Applicatorful vaginally at bedtime. Please use nightly for 7 days. 45 g 0  . ibuprofen (ADVIL,MOTRIN) 600 MG tablet Take 1 tablet (600 mg total) by mouth every 6 (six) hours. 30 tablet 0  . norethindrone (MICRONOR,CAMILA,ERRIN) 0.35 MG tablet Take 1 tablet (0.35 mg total) by mouth daily. Start pills today Rec abstinence until after your postpartum visit. 1 Package 11  . Prenatal Multivit-Min-Fe-FA (PRENATAL VITAMINS) 0.8 MG tablet Take 1 tablet by mouth daily. 30 tablet 4   No current facility-administered medications for this visit.     Social Hx:  reports that she has never smoked. She has never used smokeless tobacco.   Objective:   There were no vitals taken for this visit. Physical Exam  Gen: NAD, alert, cooperative with exam, well-appearing HEENT: NCAT, PERRL, clear conjunctiva, oropharynx clear, supple neck Cardiac: Regular rate and rhythm, normal S1/S2, no murmur, no edema, capillary refill brisk  Respiratory: Clear to auscultation bilaterally, no wheezes, non-labored breathing Gastrointestinal: soft, non tender, non distended, bowel sounds present Skin: no rashes, normal turgor  Neurological: no gross deficits.  Psych: good  insight, normal mood and affect  Assessment & Plan:  No problem-specific Assessment & Plan notes found for this encounter.  No orders of the defined types were placed in this encounter.  No orders of the defined types were placed in this encounter.   Anders Simmondshristina Gambino, MD [facility], PGY-2

## 2016-11-06 ENCOUNTER — Ambulatory Visit: Payer: Self-pay | Admitting: Family Medicine

## 2017-10-29 NOTE — Telephone Encounter (Signed)
Preadmission screen  

## 2020-01-26 ENCOUNTER — Other Ambulatory Visit: Payer: Self-pay

## 2020-01-26 DIAGNOSIS — Z20822 Contact with and (suspected) exposure to covid-19: Secondary | ICD-10-CM

## 2020-01-29 LAB — NOVEL CORONAVIRUS, NAA: SARS-CoV-2, NAA: DETECTED — AB

## 2020-01-30 ENCOUNTER — Telehealth: Payer: Self-pay | Admitting: Physician Assistant

## 2020-01-30 NOTE — Telephone Encounter (Signed)
  Called to discuss with patient about Covid symptoms and the use of casirivimab/imdevimab, a monoclonal antibody infusion for those with mild to moderate Covid symptoms and at a high risk of hospitalization.    Message left to call back our hotline [phone] and sent my chart message.   Tayquan Gassman, PA - C
